# Patient Record
Sex: Female | Born: 1940 | Race: Black or African American | Hispanic: No | Smoking: Former smoker
Health system: Southern US, Community
[De-identification: ages and names within clinical notes are randomized; demographics above are authoritative.]

## PROBLEM LIST (undated history)

## (undated) DIAGNOSIS — I10 Essential (primary) hypertension: Secondary | ICD-10-CM

## (undated) DIAGNOSIS — J449 Chronic obstructive pulmonary disease, unspecified: Secondary | ICD-10-CM

## (undated) DIAGNOSIS — C3490 Malignant neoplasm of unspecified part of unspecified bronchus or lung: Secondary | ICD-10-CM

---

## 2007-06-03 ENCOUNTER — Encounter: Payer: Self-pay | Admitting: Pulmonary Disease

## 2007-06-22 ENCOUNTER — Encounter: Payer: Self-pay | Admitting: Pulmonary Disease

## 2007-06-24 ENCOUNTER — Encounter: Payer: Self-pay | Admitting: Pulmonary Disease

## 2007-06-29 ENCOUNTER — Ambulatory Visit (HOSPITAL_COMMUNITY): Admission: RE | Admit: 2007-06-29 | Discharge: 2007-06-29 | Payer: Self-pay | Admitting: Internal Medicine

## 2007-06-29 ENCOUNTER — Encounter: Payer: Self-pay | Admitting: Pulmonary Disease

## 2007-07-10 ENCOUNTER — Ambulatory Visit: Payer: Self-pay | Admitting: Pulmonary Disease

## 2007-07-10 DIAGNOSIS — M949 Disorder of cartilage, unspecified: Secondary | ICD-10-CM

## 2007-07-10 DIAGNOSIS — M899 Disorder of bone, unspecified: Secondary | ICD-10-CM | POA: Insufficient documentation

## 2007-07-10 DIAGNOSIS — J438 Other emphysema: Secondary | ICD-10-CM | POA: Insufficient documentation

## 2007-07-10 DIAGNOSIS — I10 Essential (primary) hypertension: Secondary | ICD-10-CM | POA: Insufficient documentation

## 2007-07-10 DIAGNOSIS — J984 Other disorders of lung: Secondary | ICD-10-CM | POA: Insufficient documentation

## 2007-07-10 DIAGNOSIS — E785 Hyperlipidemia, unspecified: Secondary | ICD-10-CM | POA: Insufficient documentation

## 2010-05-15 NOTE — Assessment & Plan Note (Signed)
Summary: consult for emphysema, pulmonary nodules.   Referred by:  Sudie Bailey PCP:  Philemon Kingdom  Chief Complaint:  Pulmonary Consult.  History of Present Illness: The pt is a 70y/o female who I have been asked to see for emphysema and abnormal ct chest.  The patient has had pulmonary function studies that showed very severe airflow obstruction with an FEV1 of 0.6 8 L, and an FEV1 percent of 34.  The patient has less than one block dyspnea on exertion at a moderate pace on flat ground.  She will get extremely winded with any activity of daily living or housework.  She is currently on a very aggressive bronchodilator regimen, and feels this has helped quite a bit.  It should be noted that her pulmonary function studies were done on her current bronchodilator regimen.  The patient has had very little cough since quitting smoking 6 months ago, but admits that she will snitch an occasional cigarette.  She has had significant weight loss and decrease in appetite.  She has had no significant lower extremity edema.   Serial Vital Signs/Assessments:  Comments: 12:20 PM Ambulatory Pulse Oximetry  Resting; HR_89____    02 Sat___96%ra__  Lap1 (185 feet)   HR___97__   02 Sat__94%ra___ Lap2 (185 feet)   HR___101__   02 Sat__92%ra___    Lap3 (185 feet)   HR__103___   02 Sat__91%ra_  _X__Test Completed without Difficulty ___Test Stopped due to: ..................................................................Marland KitchenCyndia Diver LPN  July 10, 2007 12:21 PM   By: Cyndia Diver LPN       Current Allergies: ! PENICILLIN  Past Medical History:    Current Problems:     OSTEOPENIA (ICD-733.90)    HYPERTENSION (ICD-401.9)    HYPERLIPIDEMIA (ICD-272.4)           Family History:    Father - heart disease    Sister - breast cancer    Paternal grandmother - cancer  Social History:    widow with children   Risk Factors:  Tobacco use:  quit    Year quit:  2008?    Pack-years:  20-25  years - 1ppd    Comments:  still snitches cigarettes.   Review of Systems      See HPI   Vital Signs:  Patient Profile:   70 Years Old Female Height:     57 inches Weight:      95.50 pounds O2 Sat:      96 % Temp:     98.3 degrees F oral Pulse rate:   89 / minute BP sitting:   134 / 78  (right arm)  Vitals Entered By: Cyndia Diver LPN (July 10, 2007 11:27 AM)             Comments Medications reviewed with patient ..................................................................Marland KitchenCyndia Diver LPN  July 10, 2007 11:43 AM      Physical Exam  General:     cachectic female in in no distress Eyes:     PERRLA and EOMI.   Nose:     narrowed but patent Mouth:     clear Neck:     no JVD, thyromegaly, or lymphadenopathy. Lungs:     very diminished breath sounds throughout with no wheezing. Heart:     regular rate and rhythm, no MRG Abdomen:     soft and nontender, bowel sounds present Extremities:     no significant lower extremity edema, pulses intact distally Neurologic:     alert and oriented, moves all 4 extremities.  Impression & Recommendations:  Problem # 1:  PULMONARY NODULE (ICD-518.89) the patient has on her CAT scan a 8.2-mm right middle lobe nodule, and a 3.5-mL lingular nodule.  She has also had a PET scan which shows a weakly positive right middle lobe nodule.  Unfortunately, both of these nodules are below the resolution of PET which typically needs 1 cm or greater.  Neither of these nodules are amenable to needle aspiration or bronchoscopic biopsy, because of their size.  The patient would be an extremely high risk of pneumothorax.  The truth of the matter is the patient is not a surgical candidate, and a very poor radiation and chemo candidate.  We can follow the nodule in question to look for size change,  just to let her know prognosis.  However, it is very doubtful that she can ever take any kind of treatment for this.  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  She is on a very good bronchodilator regimen, and there is really nothing further to add to her regimen.  At this point, she will benefit from increasing protein calorie intake, as well as participating in a pulmonary rehabilitation program.  There is a good [facility] available at [facility].  I have again counseled her on total smoking cessation.  Medications Added to Medication List This Visit: 1)  Proair Hfa 108 (90 Base) Mcg/act Aers (Albuterol sulfate) 2)  Pravachol 20 Mg Tabs (Pravastatin sodium) .... One every day 3)  Albuterol Sulfate (2.5 Mg/63ml) 0.083% Nebu (Albuterol sulfate) .... Use in nebulizer four times a day 4)  Hydrochlorothiazide 12.5 Mg Tabs (Hydrochlorothiazide) .... One every day 5)  Spiriva Handihaler 18 Mcg Caps (Tiotropium bromide monohydrate) .... Inhale contents of 1 capsule once a day 6)  Advair Diskus 500-50 Mcg/dose Misc (Fluticasone-salmeterol) .... Inhale 1 puff two times a day 7)  Fosamax 70 Mg Tabs (Alendronate sodium) .... One by mouth each week.  Other Orders: Radiology Referral (Radiology)   Patient Instructions: 1)  will do f/u cat scan of your chest in 3 mos. 2)  take in as many calories as possible 3)  stay on current inhalers. 4)  will recommend to primary MD to refer you to pulmonary rehab program at [facility]. 5)  stop smoking completely. 6)  will arrange for f/u after ct scan done.    ]

## 2010-12-10 ENCOUNTER — Encounter: Payer: Self-pay | Admitting: Vascular Surgery

## 2011-05-13 ENCOUNTER — Emergency Department (INDEPENDENT_AMBULATORY_CARE_PROVIDER_SITE_OTHER): Payer: Medicare FFS

## 2011-05-13 ENCOUNTER — Encounter (HOSPITAL_BASED_OUTPATIENT_CLINIC_OR_DEPARTMENT_OTHER): Payer: Self-pay | Admitting: Family Medicine

## 2011-05-13 ENCOUNTER — Emergency Department (HOSPITAL_BASED_OUTPATIENT_CLINIC_OR_DEPARTMENT_OTHER)
Admission: EM | Admit: 2011-05-13 | Discharge: 2011-05-13 | Disposition: A | Discharge status: Home / Self Care | Payer: Medicare FFS | Attending: Emergency Medicine | Admitting: Emergency Medicine

## 2011-05-13 DIAGNOSIS — R0602 Shortness of breath: Secondary | ICD-10-CM

## 2011-05-13 DIAGNOSIS — R05 Cough: Secondary | ICD-10-CM

## 2011-05-13 DIAGNOSIS — J209 Acute bronchitis, unspecified: Secondary | ICD-10-CM

## 2011-05-13 DIAGNOSIS — J4489 Other specified chronic obstructive pulmonary disease: Secondary | ICD-10-CM | POA: Insufficient documentation

## 2011-05-13 DIAGNOSIS — J449 Chronic obstructive pulmonary disease, unspecified: Secondary | ICD-10-CM | POA: Insufficient documentation

## 2011-05-13 DIAGNOSIS — R059 Cough, unspecified: Secondary | ICD-10-CM

## 2011-05-13 DIAGNOSIS — I10 Essential (primary) hypertension: Secondary | ICD-10-CM | POA: Insufficient documentation

## 2011-05-13 HISTORY — DX: Chronic obstructive pulmonary disease, unspecified: J44.9

## 2011-05-13 HISTORY — DX: Essential (primary) hypertension: I10

## 2011-05-13 MED ORDER — ALBUTEROL SULFATE HFA 108 (90 BASE) MCG/ACT IN AERS: 2.0000 | INHALATION_SPRAY | RESPIRATORY_TRACT | Status: DC | PRN

## 2011-05-13 MED ORDER — ALBUTEROL SULFATE (5 MG/ML) 0.5% IN NEBU
5.0000 mg | INHALATION_SOLUTION | Freq: Once | RESPIRATORY_TRACT | Status: AC
  Administered 2011-05-13: 5 mg via RESPIRATORY_TRACT

## 2011-05-13 MED ORDER — AZITHROMYCIN 250 MG PO TABS: 250.0000 mg | ORAL_TABLET | Freq: Every day | ORAL | Status: AC

## 2011-05-13 MED ORDER — ALBUTEROL SULFATE (5 MG/ML) 0.5% IN NEBU
INHALATION_SOLUTION | RESPIRATORY_TRACT | Status: AC
  Administered 2011-05-13: 5 mg via RESPIRATORY_TRACT
  Filled 2011-05-13: qty 1

## 2011-05-13 MED ORDER — IPRATROPIUM BROMIDE 0.02 % IN SOLN
RESPIRATORY_TRACT | Status: AC
  Administered 2011-05-13: 0.5 mg via RESPIRATORY_TRACT
  Filled 2011-05-13: qty 2.5

## 2011-05-13 MED ORDER — IPRATROPIUM BROMIDE 0.02 % IN SOLN
0.5000 mg | Freq: Once | RESPIRATORY_TRACT | Status: AC
  Administered 2011-05-13: 0.5 mg via RESPIRATORY_TRACT

## 2011-05-13 MED ORDER — PREDNISONE 20 MG PO TABS: 40.0000 mg | ORAL_TABLET | Freq: Every day | ORAL | Status: DC

## 2011-05-13 NOTE — ED Provider Notes (Signed)
History     CSN: [id number]  Arrival date & time 05/13/11  1123   First MD Initiated Contact with Patient 05/13/11 1204      Chief Complaint  Patient presents with  . Shortness of Breath    (Consider location/radiation/quality/duration/timing/severity/associated sxs/prior treatment) HPI Comments: The patient presents, wheezing, reporting shortness of breath with cough and nasal congestion over the last week. She denies any fever or chills. I have evaluated her after her nebulized breathing treatment at this time she is in no respiratory distress without any wheezing. She reports that her cough has been productive of sputum. The patient has a history of COPD.  Patient is a 71 y.o. female presenting with shortness of breath. The history is provided by the patient and a relative.  Shortness of Breath  The current episode started more than 1 week ago. The onset was gradual. The problem occurs frequently. The problem has been gradually worsening. The problem is moderate. The symptoms are relieved by rest. The symptoms are aggravated by activity. Associated symptoms include rhinorrhea, cough and shortness of breath. Pertinent negatives include no chest pain, no chest pressure, no orthopnea, no fever, no sore throat, no stridor and no wheezing. She was not exposed to toxic fumes. She has not inhaled smoke recently. Past medical history comments: COPD. She has been behaving normally. Urine output has been normal. The last void occurred less than 6 hours ago. There were no sick contacts. She has received no recent medical care.    Past Medical History  Diagnosis Date  . Asthma   . COPD (chronic obstructive pulmonary disease)   . Emphysema   . Hypertension     History reviewed. No pertinent past surgical history.  No family history on file.  History  Substance Use Topics  . Smoking status: Former Games developer  . Smokeless tobacco: Not on file  . Alcohol Use: No    OB History    Grav Para  Term Preterm Abortions TAB SAB Ect Mult Living                  Review of Systems  Constitutional: Positive for chills and fatigue. Negative for fever, diaphoresis, activity change, appetite change and unexpected weight change.  HENT: Positive for congestion and rhinorrhea. Negative for hearing loss, ear pain, nosebleeds, sore throat, mouth sores, neck pain, neck stiffness, dental problem, postnasal drip, sinus pressure and ear discharge.   Eyes: Negative for photophobia, pain, discharge and redness.  Respiratory: Positive for cough and shortness of breath. Negative for choking, wheezing and stridor.   Cardiovascular: Negative for chest pain, palpitations, orthopnea and leg swelling.  Gastrointestinal: Negative for nausea, vomiting, abdominal pain and diarrhea.  Genitourinary: Negative for dysuria and flank pain.  Musculoskeletal: Negative for myalgias.  Skin: Negative for color change, pallor, rash and wound.  Neurological: Positive for headaches. Negative for dizziness, syncope, weakness, light-headedness and numbness.  Hematological: Negative for adenopathy.  Psychiatric/Behavioral: Negative.     Allergies  Penicillins  Home Medications   Current Outpatient Rx  Name Route Sig Dispense Refill  . ALBUTEROL SULFATE (2.5 MG/3ML) 0.083% IN NEBU Nebulization Take 2.5 mg by nebulization every 6 (six) hours as needed.    Marland Kitchen AMLODIPINE BESYLATE 10 MG PO TABS Oral Take 10 mg by mouth daily.    . CYCLOBENZAPRINE HCL 5 MG PO TABS Oral Take 5 mg by mouth 3 (three) times daily as needed.    . MOMETASONE FURO-FORMOTEROL FUM 100-5 MCG/ACT IN AERO Inhalation Inhale 2  puffs into the lungs 2 (two) times daily.    Marland Kitchen TIOTROPIUM BROMIDE MONOHYDRATE 18 MCG IN CAPS Inhalation Place 18 mcg into inhaler and inhale daily.      BP 182/75  Pulse 77  Temp(Src) 98.9 F (37.2 C) (Oral)  Resp 18  SpO2 98%  Physical Exam  Nursing note and vitals reviewed. Constitutional: She is oriented to person, place,  and time. She appears well-developed and well-nourished. No distress.  HENT:  Head: Normocephalic and atraumatic.  Right Ear: Hearing, tympanic membrane, external ear and ear canal normal.  Left Ear: Hearing, tympanic membrane, external ear and ear canal normal.  Nose: Mucosal edema and rhinorrhea present. Right sinus exhibits no maxillary sinus tenderness and no frontal sinus tenderness. Left sinus exhibits no maxillary sinus tenderness and no frontal sinus tenderness.  Mouth/Throat: Oropharynx is clear and moist. No oropharyngeal exudate.  Eyes: Conjunctivae and EOM are normal. Pupils are equal, round, and reactive to light. Right eye exhibits no discharge. Left eye exhibits no discharge.  Neck: Normal range of motion. Neck supple. No JVD present. No tracheal deviation present.  Cardiovascular: Normal rate, regular rhythm, normal heart sounds and intact distal pulses.  Exam reveals no gallop and no friction rub.   No murmur heard. Pulmonary/Chest: Effort normal. No accessory muscle usage or stridor. Not tachypneic. No respiratory distress. She has no decreased breath sounds. She has wheezes in the right upper field, the right middle field, the left upper field and the left middle field. She has no rhonchi. She has no rales. She exhibits no tenderness.  Abdominal: Soft. Bowel sounds are normal. She exhibits no distension. There is no tenderness. There is no rebound and no guarding.  Musculoskeletal: Normal range of motion. She exhibits no edema and no tenderness.  Lymphadenopathy:    She has no cervical adenopathy.  Neurological: She is alert and oriented to person, place, and time. She has normal reflexes. No cranial nerve deficit. She exhibits normal muscle tone. Coordination normal.  Skin: Skin is warm and dry. No rash noted. She is not diaphoretic. No erythema. No pallor.  Psychiatric: She has a normal mood and affect. Her behavior is normal. Judgment and thought content normal.    ED  Course  Procedures (including critical care time)  Labs Reviewed - No data to display No results found.   No diagnosis found.    MDM  On physical examination, no signs of pneumonia are present, as there is no rhonchi or rales heard. However, with the course of illness lasting more than a week, this is a possibility and I will assess for a chest x-ray. Otherwise, I perceive that the patient has an upper respiratory infection, a bronchitis, exacerbating her COPD. Symptoms have been controlled with albuterol and Atrovent times one in the emergency department. If there is no pneumonia seen on the chest x-ray, I will treat her with an antibiotic, a 5 day course of oral steroid, and albuterol. The patient states her understanding of and agreement with the plan of care.        Felisa Bonier, MD 05/13/11 1300

## 2011-05-13 NOTE — ED Notes (Signed)
Pt c/o increased shortness of breath with exertion. Pt able to speak in full sentences with minimal difficulty. Pt sts she informed PCP. Pt has h/o copd.

## 2014-04-19 DIAGNOSIS — I129 Hypertensive chronic kidney disease with stage 1 through stage 4 chronic kidney disease, or unspecified chronic kidney disease: Secondary | ICD-10-CM | POA: Diagnosis not present

## 2014-04-19 DIAGNOSIS — K219 Gastro-esophageal reflux disease without esophagitis: Secondary | ICD-10-CM | POA: Diagnosis not present

## 2014-04-19 DIAGNOSIS — J45909 Unspecified asthma, uncomplicated: Secondary | ICD-10-CM | POA: Diagnosis not present

## 2014-04-19 DIAGNOSIS — J449 Chronic obstructive pulmonary disease, unspecified: Secondary | ICD-10-CM | POA: Diagnosis not present

## 2014-04-19 DIAGNOSIS — M150 Primary generalized (osteo)arthritis: Secondary | ICD-10-CM | POA: Diagnosis not present

## 2014-04-19 DIAGNOSIS — J9620 Acute and chronic respiratory failure, unspecified whether with hypoxia or hypercapnia: Secondary | ICD-10-CM | POA: Diagnosis not present

## 2014-04-19 DIAGNOSIS — N189 Chronic kidney disease, unspecified: Secondary | ICD-10-CM | POA: Diagnosis not present

## 2014-04-20 DIAGNOSIS — J441 Chronic obstructive pulmonary disease with (acute) exacerbation: Secondary | ICD-10-CM | POA: Diagnosis not present

## 2014-04-20 DIAGNOSIS — I10 Essential (primary) hypertension: Secondary | ICD-10-CM | POA: Diagnosis not present

## 2014-04-21 DIAGNOSIS — K219 Gastro-esophageal reflux disease without esophagitis: Secondary | ICD-10-CM | POA: Diagnosis not present

## 2014-04-21 DIAGNOSIS — M150 Primary generalized (osteo)arthritis: Secondary | ICD-10-CM | POA: Diagnosis not present

## 2014-04-21 DIAGNOSIS — J449 Chronic obstructive pulmonary disease, unspecified: Secondary | ICD-10-CM | POA: Diagnosis not present

## 2014-04-21 DIAGNOSIS — J9620 Acute and chronic respiratory failure, unspecified whether with hypoxia or hypercapnia: Secondary | ICD-10-CM | POA: Diagnosis not present

## 2014-04-21 DIAGNOSIS — J45909 Unspecified asthma, uncomplicated: Secondary | ICD-10-CM | POA: Diagnosis not present

## 2014-04-21 DIAGNOSIS — I129 Hypertensive chronic kidney disease with stage 1 through stage 4 chronic kidney disease, or unspecified chronic kidney disease: Secondary | ICD-10-CM | POA: Diagnosis not present

## 2014-04-21 DIAGNOSIS — N189 Chronic kidney disease, unspecified: Secondary | ICD-10-CM | POA: Diagnosis not present

## 2014-04-22 DIAGNOSIS — M150 Primary generalized (osteo)arthritis: Secondary | ICD-10-CM | POA: Diagnosis not present

## 2014-04-22 DIAGNOSIS — I129 Hypertensive chronic kidney disease with stage 1 through stage 4 chronic kidney disease, or unspecified chronic kidney disease: Secondary | ICD-10-CM | POA: Diagnosis not present

## 2014-04-22 DIAGNOSIS — J45909 Unspecified asthma, uncomplicated: Secondary | ICD-10-CM | POA: Diagnosis not present

## 2014-04-22 DIAGNOSIS — J9620 Acute and chronic respiratory failure, unspecified whether with hypoxia or hypercapnia: Secondary | ICD-10-CM | POA: Diagnosis not present

## 2014-04-22 DIAGNOSIS — J449 Chronic obstructive pulmonary disease, unspecified: Secondary | ICD-10-CM | POA: Diagnosis not present

## 2014-04-22 DIAGNOSIS — N189 Chronic kidney disease, unspecified: Secondary | ICD-10-CM | POA: Diagnosis not present

## 2014-04-22 DIAGNOSIS — K219 Gastro-esophageal reflux disease without esophagitis: Secondary | ICD-10-CM | POA: Diagnosis not present

## 2014-04-24 DIAGNOSIS — J449 Chronic obstructive pulmonary disease, unspecified: Secondary | ICD-10-CM | POA: Diagnosis not present

## 2014-04-25 DIAGNOSIS — I129 Hypertensive chronic kidney disease with stage 1 through stage 4 chronic kidney disease, or unspecified chronic kidney disease: Secondary | ICD-10-CM | POA: Diagnosis not present

## 2014-04-25 DIAGNOSIS — J449 Chronic obstructive pulmonary disease, unspecified: Secondary | ICD-10-CM | POA: Diagnosis not present

## 2014-04-25 DIAGNOSIS — K219 Gastro-esophageal reflux disease without esophagitis: Secondary | ICD-10-CM | POA: Diagnosis not present

## 2014-04-25 DIAGNOSIS — J45909 Unspecified asthma, uncomplicated: Secondary | ICD-10-CM | POA: Diagnosis not present

## 2014-04-25 DIAGNOSIS — N189 Chronic kidney disease, unspecified: Secondary | ICD-10-CM | POA: Diagnosis not present

## 2014-04-25 DIAGNOSIS — J9620 Acute and chronic respiratory failure, unspecified whether with hypoxia or hypercapnia: Secondary | ICD-10-CM | POA: Diagnosis not present

## 2014-04-25 DIAGNOSIS — M150 Primary generalized (osteo)arthritis: Secondary | ICD-10-CM | POA: Diagnosis not present

## 2014-04-26 DIAGNOSIS — R5383 Other fatigue: Secondary | ICD-10-CM | POA: Diagnosis not present

## 2014-04-26 DIAGNOSIS — R0600 Dyspnea, unspecified: Secondary | ICD-10-CM | POA: Diagnosis not present

## 2014-04-26 DIAGNOSIS — G4733 Obstructive sleep apnea (adult) (pediatric): Secondary | ICD-10-CM | POA: Diagnosis not present

## 2014-04-26 DIAGNOSIS — J3089 Other allergic rhinitis: Secondary | ICD-10-CM | POA: Diagnosis not present

## 2014-04-27 DIAGNOSIS — J449 Chronic obstructive pulmonary disease, unspecified: Secondary | ICD-10-CM | POA: Diagnosis not present

## 2014-04-27 DIAGNOSIS — M150 Primary generalized (osteo)arthritis: Secondary | ICD-10-CM | POA: Diagnosis not present

## 2014-04-27 DIAGNOSIS — J9620 Acute and chronic respiratory failure, unspecified whether with hypoxia or hypercapnia: Secondary | ICD-10-CM | POA: Diagnosis not present

## 2014-04-27 DIAGNOSIS — J45909 Unspecified asthma, uncomplicated: Secondary | ICD-10-CM | POA: Diagnosis not present

## 2014-04-27 DIAGNOSIS — K219 Gastro-esophageal reflux disease without esophagitis: Secondary | ICD-10-CM | POA: Diagnosis not present

## 2014-04-27 DIAGNOSIS — I129 Hypertensive chronic kidney disease with stage 1 through stage 4 chronic kidney disease, or unspecified chronic kidney disease: Secondary | ICD-10-CM | POA: Diagnosis not present

## 2014-04-27 DIAGNOSIS — N189 Chronic kidney disease, unspecified: Secondary | ICD-10-CM | POA: Diagnosis not present

## 2014-04-29 DIAGNOSIS — J45909 Unspecified asthma, uncomplicated: Secondary | ICD-10-CM | POA: Diagnosis not present

## 2014-04-29 DIAGNOSIS — I129 Hypertensive chronic kidney disease with stage 1 through stage 4 chronic kidney disease, or unspecified chronic kidney disease: Secondary | ICD-10-CM | POA: Diagnosis not present

## 2014-04-29 DIAGNOSIS — J9620 Acute and chronic respiratory failure, unspecified whether with hypoxia or hypercapnia: Secondary | ICD-10-CM | POA: Diagnosis not present

## 2014-04-29 DIAGNOSIS — M150 Primary generalized (osteo)arthritis: Secondary | ICD-10-CM | POA: Diagnosis not present

## 2014-04-29 DIAGNOSIS — J449 Chronic obstructive pulmonary disease, unspecified: Secondary | ICD-10-CM | POA: Diagnosis not present

## 2014-04-29 DIAGNOSIS — N189 Chronic kidney disease, unspecified: Secondary | ICD-10-CM | POA: Diagnosis not present

## 2014-04-29 DIAGNOSIS — K219 Gastro-esophageal reflux disease without esophagitis: Secondary | ICD-10-CM | POA: Diagnosis not present

## 2014-05-02 DIAGNOSIS — I10 Essential (primary) hypertension: Secondary | ICD-10-CM | POA: Diagnosis not present

## 2014-05-02 DIAGNOSIS — J449 Chronic obstructive pulmonary disease, unspecified: Secondary | ICD-10-CM | POA: Diagnosis not present

## 2014-05-02 DIAGNOSIS — M2662 Arthralgia of temporomandibular joint: Secondary | ICD-10-CM | POA: Diagnosis not present

## 2014-05-04 DIAGNOSIS — M150 Primary generalized (osteo)arthritis: Secondary | ICD-10-CM | POA: Diagnosis not present

## 2014-05-04 DIAGNOSIS — J9620 Acute and chronic respiratory failure, unspecified whether with hypoxia or hypercapnia: Secondary | ICD-10-CM | POA: Diagnosis not present

## 2014-05-04 DIAGNOSIS — J449 Chronic obstructive pulmonary disease, unspecified: Secondary | ICD-10-CM | POA: Diagnosis not present

## 2014-05-04 DIAGNOSIS — J45909 Unspecified asthma, uncomplicated: Secondary | ICD-10-CM | POA: Diagnosis not present

## 2014-05-04 DIAGNOSIS — N189 Chronic kidney disease, unspecified: Secondary | ICD-10-CM | POA: Diagnosis not present

## 2014-05-04 DIAGNOSIS — K219 Gastro-esophageal reflux disease without esophagitis: Secondary | ICD-10-CM | POA: Diagnosis not present

## 2014-05-04 DIAGNOSIS — I129 Hypertensive chronic kidney disease with stage 1 through stage 4 chronic kidney disease, or unspecified chronic kidney disease: Secondary | ICD-10-CM | POA: Diagnosis not present

## 2014-05-06 DIAGNOSIS — J449 Chronic obstructive pulmonary disease, unspecified: Secondary | ICD-10-CM | POA: Diagnosis not present

## 2014-05-06 DIAGNOSIS — N189 Chronic kidney disease, unspecified: Secondary | ICD-10-CM | POA: Diagnosis not present

## 2014-05-06 DIAGNOSIS — I129 Hypertensive chronic kidney disease with stage 1 through stage 4 chronic kidney disease, or unspecified chronic kidney disease: Secondary | ICD-10-CM | POA: Diagnosis not present

## 2014-05-06 DIAGNOSIS — J45909 Unspecified asthma, uncomplicated: Secondary | ICD-10-CM | POA: Diagnosis not present

## 2014-05-06 DIAGNOSIS — K219 Gastro-esophageal reflux disease without esophagitis: Secondary | ICD-10-CM | POA: Diagnosis not present

## 2014-05-06 DIAGNOSIS — J9620 Acute and chronic respiratory failure, unspecified whether with hypoxia or hypercapnia: Secondary | ICD-10-CM | POA: Diagnosis not present

## 2014-05-06 DIAGNOSIS — M150 Primary generalized (osteo)arthritis: Secondary | ICD-10-CM | POA: Diagnosis not present

## 2014-05-10 DIAGNOSIS — K219 Gastro-esophageal reflux disease without esophagitis: Secondary | ICD-10-CM | POA: Diagnosis not present

## 2014-05-10 DIAGNOSIS — N189 Chronic kidney disease, unspecified: Secondary | ICD-10-CM | POA: Diagnosis not present

## 2014-05-10 DIAGNOSIS — J45909 Unspecified asthma, uncomplicated: Secondary | ICD-10-CM | POA: Diagnosis not present

## 2014-05-10 DIAGNOSIS — J9620 Acute and chronic respiratory failure, unspecified whether with hypoxia or hypercapnia: Secondary | ICD-10-CM | POA: Diagnosis not present

## 2014-05-10 DIAGNOSIS — M150 Primary generalized (osteo)arthritis: Secondary | ICD-10-CM | POA: Diagnosis not present

## 2014-05-10 DIAGNOSIS — I129 Hypertensive chronic kidney disease with stage 1 through stage 4 chronic kidney disease, or unspecified chronic kidney disease: Secondary | ICD-10-CM | POA: Diagnosis not present

## 2014-05-10 DIAGNOSIS — J449 Chronic obstructive pulmonary disease, unspecified: Secondary | ICD-10-CM | POA: Diagnosis not present

## 2014-05-12 DIAGNOSIS — J449 Chronic obstructive pulmonary disease, unspecified: Secondary | ICD-10-CM | POA: Diagnosis not present

## 2014-05-12 DIAGNOSIS — I129 Hypertensive chronic kidney disease with stage 1 through stage 4 chronic kidney disease, or unspecified chronic kidney disease: Secondary | ICD-10-CM | POA: Diagnosis not present

## 2014-05-12 DIAGNOSIS — M150 Primary generalized (osteo)arthritis: Secondary | ICD-10-CM | POA: Diagnosis not present

## 2014-05-12 DIAGNOSIS — J45909 Unspecified asthma, uncomplicated: Secondary | ICD-10-CM | POA: Diagnosis not present

## 2014-05-12 DIAGNOSIS — J9620 Acute and chronic respiratory failure, unspecified whether with hypoxia or hypercapnia: Secondary | ICD-10-CM | POA: Diagnosis not present

## 2014-05-12 DIAGNOSIS — K219 Gastro-esophageal reflux disease without esophagitis: Secondary | ICD-10-CM | POA: Diagnosis not present

## 2014-05-12 DIAGNOSIS — N189 Chronic kidney disease, unspecified: Secondary | ICD-10-CM | POA: Diagnosis not present

## 2014-05-25 DIAGNOSIS — J449 Chronic obstructive pulmonary disease, unspecified: Secondary | ICD-10-CM | POA: Diagnosis not present

## 2014-06-13 ENCOUNTER — Emergency Department (HOSPITAL_BASED_OUTPATIENT_CLINIC_OR_DEPARTMENT_OTHER): Payer: Commercial Managed Care - HMO

## 2014-06-13 ENCOUNTER — Emergency Department (HOSPITAL_BASED_OUTPATIENT_CLINIC_OR_DEPARTMENT_OTHER)
Admission: EM | Admit: 2014-06-13 | Discharge: 2014-06-13 | Disposition: A | Discharge status: Home / Self Care | Payer: Commercial Managed Care - HMO | Attending: Emergency Medicine | Admitting: Emergency Medicine

## 2014-06-13 ENCOUNTER — Encounter (HOSPITAL_BASED_OUTPATIENT_CLINIC_OR_DEPARTMENT_OTHER): Payer: Self-pay | Admitting: *Deleted

## 2014-06-13 DIAGNOSIS — Z87891 Personal history of nicotine dependence: Secondary | ICD-10-CM | POA: Insufficient documentation

## 2014-06-13 DIAGNOSIS — M79662 Pain in left lower leg: Secondary | ICD-10-CM | POA: Diagnosis present

## 2014-06-13 DIAGNOSIS — Z79899 Other long term (current) drug therapy: Secondary | ICD-10-CM | POA: Diagnosis not present

## 2014-06-13 DIAGNOSIS — I82812 Embolism and thrombosis of superficial veins of left lower extremities: Secondary | ICD-10-CM

## 2014-06-13 DIAGNOSIS — I82492 Acute embolism and thrombosis of other specified deep vein of left lower extremity: Secondary | ICD-10-CM | POA: Diagnosis not present

## 2014-06-13 DIAGNOSIS — I10 Essential (primary) hypertension: Secondary | ICD-10-CM | POA: Insufficient documentation

## 2014-06-13 DIAGNOSIS — Z880 Allergy status to penicillin: Secondary | ICD-10-CM | POA: Diagnosis not present

## 2014-06-13 DIAGNOSIS — J449 Chronic obstructive pulmonary disease, unspecified: Secondary | ICD-10-CM | POA: Insufficient documentation

## 2014-06-13 DIAGNOSIS — Z7952 Long term (current) use of systemic steroids: Secondary | ICD-10-CM | POA: Insufficient documentation

## 2014-06-13 DIAGNOSIS — I82402 Acute embolism and thrombosis of unspecified deep veins of left lower extremity: Secondary | ICD-10-CM | POA: Insufficient documentation

## 2014-06-13 LAB — CBC WITH DIFFERENTIAL/PLATELET
Basophils Absolute: 0 10*3/uL (ref 0.0–0.1)
Basophils Relative: 1 % (ref 0–1)
Eosinophils Absolute: 0.2 10*3/uL (ref 0.0–0.7)
Eosinophils Relative: 3 % (ref 0–5)
HCT: 35 % — ABNORMAL LOW (ref 36.0–46.0)
Hemoglobin: 11 g/dL — ABNORMAL LOW (ref 12.0–15.0)
Lymphocytes Relative: 42 % (ref 12–46)
Lymphs Abs: 2.7 10*3/uL (ref 0.7–4.0)
MCH: 27.5 pg (ref 26.0–34.0)
MCHC: 31.4 g/dL (ref 30.0–36.0)
MCV: 87.5 fL (ref 78.0–100.0)
Monocytes Absolute: 0.5 10*3/uL (ref 0.1–1.0)
Monocytes Relative: 8 % (ref 3–12)
Neutro Abs: 2.9 10*3/uL (ref 1.7–7.7)
Neutrophils Relative %: 46 % (ref 43–77)
Platelets: 343 10*3/uL (ref 150–400)
RBC: 4 MIL/uL (ref 3.87–5.11)
RDW: 15.2 % (ref 11.5–15.5)
WBC: 6.4 10*3/uL (ref 4.0–10.5)

## 2014-06-13 LAB — BASIC METABOLIC PANEL
Anion gap: 3 — ABNORMAL LOW (ref 5–15)
BUN: 9 mg/dL (ref 6–23)
CO2: 28 mmol/L (ref 19–32)
Calcium: 8.7 mg/dL (ref 8.4–10.5)
Chloride: 106 mmol/L (ref 96–112)
Creatinine, Ser: 0.63 mg/dL (ref 0.50–1.10)
GFR calc Af Amer: >90 mL/min (ref >90)
GFR calc non Af Amer: 87 mL/min — ABNORMAL LOW (ref >90)
Glucose, Bld: 97 mg/dL (ref 70–99)
Potassium: 3.9 mmol/L (ref 3.5–5.1)
Sodium: 137 mmol/L (ref 135–145)

## 2014-06-13 NOTE — Discharge Instructions (Signed)
Take an aspirin everyday. Follow up with your doctor for continued or worsening symptoms Venous Thromboembolism Venous thromboembolism (VTE) is a condition where a blood clot (thrombus) develops in the body. A thrombus usually occurs in a deep vein in the leg or pelvis but can occur in an upper extremity. Sometimes pieces of the thrombus can break off from its original place of development and travel through the bloodstream to other parts of the body. When a thrombus breaks off and travels through the bloodstream, it is called an embolism. The embolism can block the blood flow in the blood vessels of other organs. There are two serious types of VTE:  Deep vein thrombosis (DVT). A DVT is a thrombus that usually occurs in a deep vein of the lower legs, pelvis, or in an upper extremity.  Pulmonary embolism (PE). A PE occurs when an embolism has formed and traveled to the lungs. A PE can block or decrease the blood flow in one or both lungs. Venous thromboembolism is a serious health condition that can cause disability or death. It is very important to not ignore symptoms or delay treatment.   CAUSES   A blood clot can form in a vein from different conditions. A blood clot can develop due to:  Blood flow within a vein that is sluggish or very slow.  Medical conditions that make the blood clot easily.  Vein damage. RISK FACTORS Risk factors can increase your risk of developing a blood clot. Risk factors can include:  Smoking.  Obesity.  Age.  Immobility or sedentary lifestyle.  Sitting or standing for long periods of time.  Chronic or long-term bedrest.  Medical or past history of blood clots.  Family history of blood clots.  Hip, leg, or pelvis injury or trauma.  Major surgery, especially surgery on the hip, knee, or abdomen.  Pregnancy and childbirth.  Birth control pills and hormone replacement therapy.  Medical conditions such as  Peripheral vascular disease  (PVD).  Diabetes.  Cancer. SIGNS AND SYMPTOMS  Symptoms of VTE can depend on where the clot is located and if the clot breaks off and travels to another organ. Sometimes, there may be no symptoms.   DVT symptoms can include:  Swelling of the leg or arm, especially on one side.  Warmth and redness of the leg or arm, especially on one side.  Pain in an arm or leg. Leg pain may be more noticeable or worse when standing or walking.  PE symptoms can include:  Shortness of breath.  Coughing.  Coughing up blood or blood-tinged mucus (hemoptysis).  Chest pain or chest pain with deep breaths (pleuritic chest pain).  Apprehension, anxiety, or a feeling of impending doom.  Rapid heartbeat. A PE is a medical emergency. Call your local emergency services (911 in [place] if you have these symptoms. DIAGNOSIS  A venous thromboembolism is diagnosed by:  Looking at your medical history and risk factors. Your health care provider will perform a physical exam.  Blood tests, including blood work of how your blood clots.  Imaging tests that can detect a blood clot may be ordered. These can include:  Ultrasonography.  Computed Tomography (CT) scan.  Magnetic Resonance Imaging (MRI).  Echocardiography.  Electrocardiography. TREATMENT  Initial treatment: When a venous thromboembolism has been confirmed, initial treatment consists of using blood-thinning (anticoagulant) medicines. Anticoagulant medicines affect how your blood clots and can cause bleeding. Therefore, when you are on anticoagulants, your blood clotting times are monitored by blood tests called  prothrombin time (PT) and International Normalized Ratio (INR). Typically, the anticoagulants are intravenous (IV) heparin and warfarin. IV heparin is normally started right away because it has a rapid onset of action and thins the blood quickly. Warfarin is also started with IV heparin therapy. Warfarin has a slower onset of action and  takes longer to work. This overlap of IV heparin and oral warfarin therapy is continued until PT and INR levels are therapeutic. After the PT and INR levels are therapeutic, IV heparin is discontinued and you are maintained on warfarin.  Other treatment options:  Catheter-directed thrombolysis. This is a clot-busting therapy for a DVT in which a small, flexible hollow tube (catheter) is threaded to the blood clot inside the vein. A clot-busting drug (thrombolytic) is then infused through the catheter. The thrombolytic helps to break up the clot in the vein and restore blood flow.  Direct thrombin inhibitor (DTI) medicine. A DTI is an anticoagulant that slows blood clotting. It is given through an IV.  If you cannot take an anticoagulant, a filter called an inferior vena cava filter (IVC filter) can be placed. The IVC filter is placed in a large vein, in either your leg or abdomen. An IVC filter is left in the vein permanently. The IVC filter can help prevent blood clots from going to your lungs.  Surgery. Blood clots may need to be removed surgically if other treatment options are not working or cannot be used. Types of surgery can include:  Thrombectomy.  Embolectomy.  Venous stenting.  Pain medicine (analgesic). Medicine to control pain is given in addition to the above treatment options. Home treatment:  Continued treatment at home consists of taking either warfarin or under-the-skin (subcutaneous) injections of an anticoagulant. HOME CARE INSTRUCTIONS   Take medicines only as directed by your health care provider. Follow the directions carefully.  Warfarin. Most people will continue taking warfarin. Your health care provider will advise you on the length of treatment (usually 3 to 6 months, sometimes lifelong).  Too much and too little warfarin are both dangerous. Too much warfarin increases the risk of bleeding. Too little warfarin continues to allow the risk for blood clots. While  taking warfarin, you will need to have regular blood tests to measure your blood clotting time. These blood tests usually include both the PT and INR tests. The PT and INR results allow your health care provider to adjust your dose of warfarin. The dose can change for many reasons. It is critically important that you take warfarin exactly as prescribed, and that you have your PT and INR levels drawn exactly as directed.  Many foods, especially foods high in vitamin K can interfere with warfarin and affect the PT and INR results. Foods high in vitamin K include spinach, kale, broccoli, cabbage, collard and turnip greens, Brussels sprouts, peas, cauliflower, seaweed, and parsley as well as beef and pork liver, green tea, and soybean oil. You should eat a consistent amount of foods high in vitamin K. Avoid major changes in your diet, or notify your health care provider before changing your diet. Arrange a visit with a dietitian to answer your questions.  Many medicines can interfere with warfarin and affect the PT and INR results. You must tell your health care provider about any and all medicines you take, including all vitamins and supplements. Be especially cautious with aspirin and anti-inflammatory medicines. Do not take or discontinue any prescribed or over-the-counter medicine except on the advice of your health care provider  or pharmacist.  Warfarin can have side effects, such as excessive bruising or bleeding. You will need to hold pressure over cuts for longer than usual. Your health care provider or pharmacist will discuss other potential side effects.  Avoid sports or activities that may cause injury or bleeding.  Be mindful when shaving, flossing your teeth, or handling sharp objects.  Alcohol can change the body's ability to handle warfarin. It is best to avoid alcoholic drinks or consume only very small amounts while taking warfarin. Notify your health care provider if you change your alcohol  intake.  Notify your dentist or other health care providers before procedures.  Activity. Ask your health care provider how soon you can go back to normal activities if you have had a blood clot.  Exercise. It is very important to exercise and stay active to prevent future blood clots. This is especially important while traveling, sitting, or standing for long periods of time. Exercise your legs by walking or by pumping the muscles frequently.  Compression stockings. You may need to wear compression stockings to help prevent a DVT.  Smoking. If you smoke, quit. Ask your health care provider for help with quitting smoking.  Learn as much as you can about VTE. Educating yourself can help prevent VTE from reoccurring.  Wear a medical alert bracelet or carry a medical alert card. PREVENTION   In-hospital prevention:  Activity. Getting out of bed and walking while you are in the hospital can help prevent blood clots.  Medicines may be given to help prevent blood clots.  Sequential compression device (SCD). A SCD can help prevent blood clots in the lower legs. A compression sleeve is wrapped around each of your legs. The tubing of the sleeve is connected to a machine that pumps air into the compression sleeve. The pumping action of the sleeve helps circulate the blood in your legs.  Compression stockings. Compression stockings are tight, elastic stockings that apply pressure to the lower legs and help prevent blood from pooling in the lower legs. Compression stockings are sometimes used with SCDs.  General prevention:  Exercise regularly if you are able.  Avoid sitting or lying in bed for long periods of time without moving the legs.  Do not smoke. If you smoke, quit. Ask your health care provider for help.  Avoid exposure to smoke.  Maintain a healthy weight.  Women over the age of 54 should consider the risk of blood clots while taking birth control pills or hormone replacement  therapy.  Long distance travel along with prolonged sitting and standing can increase the risk of a DVT. Exercise your legs by walking or by pumping your leg muscles every hour. SEEK MEDICAL CARE IF:   You feel faint or dizzy.  You feel rapid or skipped heartbeats.  You feel weaker or more tired than usual.  You feel you are not getting better in the time expected.  You believe you are having side effects from medicine.  You have joint pain.  You have abdominal pain.  You have new or increased bruising. SEEK IMMEDIATE MEDICAL CARE IF:   You vomit bright red blood or your vomit has a coffee ground type appearance.  You have bowel movements that have bright red blood or are dark or tarry in appearance.  You have bleeding from your rectum.  You have pink or bloody urine.  You develop breathing problems such as shortness of breath or pain with breathing.  You are coughing up blood.  You develop warmth, swelling, or redness in an arm or a leg.  You have chest pain.  You have a sudden, unexplained severe headache.  You have a cut that does not stop bleeding after 10 minutes.  You have a nosebleed that does not stop bleeding after 10 minutes. Document Released: 01/27/2009 Document Revised: 08/16/2013 Document Reviewed: 09/11/2011 [facility] Patient Information 2015 [place], [place]. This information is not intended to replace advice given to you by your health care provider. Make sure you discuss any questions you have with your health care provider.

## 2014-06-13 NOTE — ED Provider Notes (Signed)
CSN: [id number]     Arrival date & time 06/13/14  0174 History   First MD Initiated Contact with Patient 06/13/14 [phone]     Chief Complaint  Patient presents with  . Leg Pain     (Consider location/radiation/quality/duration/timing/severity/associated sxs/prior Treatment) HPI Comments: Pt comes in with c/o left lower leg and behind the knee pain. Pt states that it has been going on for 2 weeks and seems to be worsening. Denies cp or sob. States that warm compress was helping initially but not any ore. States that he has a history of pe in the 90's. hasnt been on coumadin in years. Denies any injury to the area. Pain with worse with palpation or walking.  The history is provided by the patient. No language interpreter was used.    Past Medical History  Diagnosis Date  . Asthma   . COPD (chronic obstructive pulmonary disease)   . Emphysema   . Hypertension    History reviewed. No pertinent past surgical history. History reviewed. No pertinent family history. History  Substance Use Topics  . Smoking status: Former Research scientist (life sciences)  . Smokeless tobacco: Not on file  . Alcohol Use: No   OB History    No data available     Review of Systems  All other systems reviewed and are negative.     Allergies  Penicillins  Home Medications   Prior to Admission medications   Medication Sig Start Date End Date Taking? Authorizing Provider  albuterol (PROVENTIL HFA;VENTOLIN HFA) 108 (90 BASE) MCG/ACT inhaler Inhale 2 puffs into the lungs every 4 (four) hours as needed for wheezing or shortness of breath. 05/13/11 05/12/12  Charlena Cross, MD  albuterol (PROVENTIL) (2.5 MG/3ML) 0.083% nebulizer solution Take 2.5 mg by nebulization every 6 (six) hours as needed.    Historical Provider, MD  amLODipine (NORVASC) 10 MG tablet Take 10 mg by mouth daily.    Historical Provider, MD  cyclobenzaprine (FLEXERIL) 5 MG tablet Take 5 mg by mouth 3 (three) times daily as needed.    Historical Provider, MD   mometasone-formoterol (DULERA) 100-5 MCG/ACT AERO Inhale 2 puffs into the lungs 2 (two) times daily.    Historical Provider, MD  predniSONE (DELTASONE) 20 MG tablet Take 2 tablets (40 mg total) by mouth daily. 05/13/11   Charlena Cross, MD  tiotropium (SPIRIVA) 18 MCG inhalation capsule Place 18 mcg into inhaler and inhale daily.    Historical Provider, MD   BP 140/72 mmHg  Pulse 58  Temp(Src) 98.8 F (37.1 C) (Oral)  Resp 20  SpO2 98% Physical Exam  Constitutional: She is oriented to person, place, and time. She appears well-developed and well-nourished.  HENT:  Head: Normocephalic and atraumatic.  Cardiovascular: Normal rate and regular rhythm.   Pulmonary/Chest: Effort normal and breath sounds normal.  Abdominal: Soft. Bowel sounds are normal. There is no tenderness.  Musculoskeletal: Normal range of motion.  No definite swelling noted. No redness or warmth. Tender to palpation in the posterior left knee  Neurological: She is alert and oriented to person, place, and time. Coordination normal.  Skin: Skin is warm and dry.  Psychiatric: She has a normal mood and affect.  Nursing note and vitals reviewed.   ED Course  Procedures (including critical care time) Labs Review Labs Reviewed  BASIC METABOLIC PANEL - Abnormal; Notable for the following:    GFR calc non Af Amer 87 (*)    Anion gap 3 (*)    All other components within  normal limits  CBC WITH DIFFERENTIAL/PLATELET - Abnormal; Notable for the following:    Hemoglobin 11.0 (*)    HCT 35.0 (*)    All other components within normal limits    Imaging Review No results found.   EKG Interpretation None      MDM   Final diagnoses:  Acute superficial venous thrombosis of lower extremity, left    No dvt noted. Pt can take asa daily. Denies return precautions with pt    Glendell Docker, NP 06/13/14 1124  Veryl Speak, MD 06/13/14 1501

## 2014-06-13 NOTE — ED Notes (Signed)
Patient transported to Ultrasound 

## 2014-06-13 NOTE — ED Notes (Signed)
Pt to room 5 in w/c, reports left leg pain only with weight bearing x 2 weeks, no pain at rest. Pt states pain started in posterior calf, now is in posterior knee area. Tender to palp, no redness or swelling noted. Pt states she had a dvt 1997, with similar symptoms.

## 2014-06-13 NOTE — ED Notes (Signed)
MD at bedside. 

## 2014-06-23 DIAGNOSIS — J449 Chronic obstructive pulmonary disease, unspecified: Secondary | ICD-10-CM | POA: Diagnosis not present

## 2014-07-24 DIAGNOSIS — J449 Chronic obstructive pulmonary disease, unspecified: Secondary | ICD-10-CM | POA: Diagnosis not present

## 2014-07-25 DIAGNOSIS — G8929 Other chronic pain: Secondary | ICD-10-CM | POA: Diagnosis not present

## 2014-07-25 DIAGNOSIS — J449 Chronic obstructive pulmonary disease, unspecified: Secondary | ICD-10-CM | POA: Diagnosis not present

## 2014-07-25 DIAGNOSIS — I10 Essential (primary) hypertension: Secondary | ICD-10-CM | POA: Diagnosis not present

## 2014-08-23 DIAGNOSIS — J449 Chronic obstructive pulmonary disease, unspecified: Secondary | ICD-10-CM | POA: Diagnosis not present

## 2015-07-24 DIAGNOSIS — J449 Chronic obstructive pulmonary disease, unspecified: Secondary | ICD-10-CM | POA: Diagnosis not present

## 2015-08-07 DIAGNOSIS — J449 Chronic obstructive pulmonary disease, unspecified: Secondary | ICD-10-CM | POA: Diagnosis not present

## 2015-08-07 DIAGNOSIS — R748 Abnormal levels of other serum enzymes: Secondary | ICD-10-CM | POA: Diagnosis not present

## 2015-08-07 DIAGNOSIS — I10 Essential (primary) hypertension: Secondary | ICD-10-CM | POA: Diagnosis not present

## 2015-08-07 DIAGNOSIS — E785 Hyperlipidemia, unspecified: Secondary | ICD-10-CM | POA: Diagnosis not present

## 2015-08-07 DIAGNOSIS — M549 Dorsalgia, unspecified: Secondary | ICD-10-CM | POA: Diagnosis not present

## 2015-08-23 DIAGNOSIS — J449 Chronic obstructive pulmonary disease, unspecified: Secondary | ICD-10-CM | POA: Diagnosis not present

## 2015-09-23 DIAGNOSIS — J449 Chronic obstructive pulmonary disease, unspecified: Secondary | ICD-10-CM | POA: Diagnosis not present

## 2015-10-11 DIAGNOSIS — M549 Dorsalgia, unspecified: Secondary | ICD-10-CM | POA: Diagnosis not present

## 2015-10-11 DIAGNOSIS — R748 Abnormal levels of other serum enzymes: Secondary | ICD-10-CM | POA: Diagnosis not present

## 2015-10-11 DIAGNOSIS — J449 Chronic obstructive pulmonary disease, unspecified: Secondary | ICD-10-CM | POA: Diagnosis not present

## 2015-10-11 DIAGNOSIS — I10 Essential (primary) hypertension: Secondary | ICD-10-CM | POA: Diagnosis not present

## 2015-10-23 DIAGNOSIS — J449 Chronic obstructive pulmonary disease, unspecified: Secondary | ICD-10-CM | POA: Diagnosis not present

## 2015-11-04 ENCOUNTER — Encounter (HOSPITAL_BASED_OUTPATIENT_CLINIC_OR_DEPARTMENT_OTHER): Payer: Self-pay | Admitting: *Deleted

## 2015-11-04 ENCOUNTER — Emergency Department (HOSPITAL_BASED_OUTPATIENT_CLINIC_OR_DEPARTMENT_OTHER)
Admission: EM | Admit: 2015-11-04 | Discharge: 2015-11-04 | Disposition: A | Discharge status: Home / Self Care | Payer: Medicare Other | Attending: Emergency Medicine | Admitting: Emergency Medicine

## 2015-11-04 DIAGNOSIS — Z79899 Other long term (current) drug therapy: Secondary | ICD-10-CM | POA: Insufficient documentation

## 2015-11-04 DIAGNOSIS — I10 Essential (primary) hypertension: Secondary | ICD-10-CM | POA: Insufficient documentation

## 2015-11-04 DIAGNOSIS — H1013 Acute atopic conjunctivitis, bilateral: Secondary | ICD-10-CM | POA: Insufficient documentation

## 2015-11-04 DIAGNOSIS — H5711 Ocular pain, right eye: Secondary | ICD-10-CM | POA: Diagnosis present

## 2015-11-04 DIAGNOSIS — J449 Chronic obstructive pulmonary disease, unspecified: Secondary | ICD-10-CM | POA: Insufficient documentation

## 2015-11-04 DIAGNOSIS — Z87891 Personal history of nicotine dependence: Secondary | ICD-10-CM | POA: Diagnosis not present

## 2015-11-04 MED ORDER — TETRACAINE HCL 0.5 % OP SOLN
1.0000 [drp] | Freq: Once | OPHTHALMIC | Status: AC
  Administered 2015-11-04: 1 [drp] via OPHTHALMIC
  Filled 2015-11-04: qty 4

## 2015-11-04 MED ORDER — FLUORESCEIN SODIUM 1 MG OP STRP
1.0000 | ORAL_STRIP | Freq: Once | OPHTHALMIC | Status: AC
  Administered 2015-11-04: 1 via OPHTHALMIC
  Filled 2015-11-04: qty 1

## 2015-11-04 MED ORDER — NAPHAZOLINE-PHENIRAMINE 0.025-0.3 % OP SOLN: 1.0000 [drp] | OPHTHALMIC | Status: DC | PRN

## 2015-11-04 MED ORDER — HYPROMELLOSE (GONIOSCOPIC) 2.5 % OP SOLN: 1.0000 [drp] | Freq: Three times a day (TID) | OPHTHALMIC | Status: DC | PRN

## 2015-11-04 NOTE — ED Provider Notes (Signed)
CSN: [id number]     Arrival date & time 11/04/15  1146 History   First MD Initiated Contact with Patient 11/04/15 1205     Chief Complaint  Patient presents with  . Eye Drainage  . itching in eye      (Consider location/radiation/quality/duration/timing/severity/associated sxs/prior Treatment) HPI   Patient is a 75 year old female past medical history of hypertension, asthma and COPD who presents to the ED with complaint of eye pain, onset 6 days. Patient reports over the past week she has had watering, itching and gritty/scratchy sensation to her right eye. She reports over the past few days she has begun to have similar symptoms in her left eye. Patient also endorses associated nasal congestion. Denies fever, chills, headache, neck stiffness, visual changes, photophobia, purulent drainage, matting of eyes, ear pain, sore throat, cough, difficulty breathing. Patient denies applying any eyedrops at home. Denies contact use. Endorses history of seasonal allergies but states she is currently not on any medications.   Past Medical History  Diagnosis Date  . Asthma   . COPD (chronic obstructive pulmonary disease) ([place])   . Emphysema   . Hypertension    History reviewed. No pertinent past surgical history. History reviewed. No pertinent family history. Social History  Substance Use Topics  . Smoking status: Former Research scientist (life sciences)  . Smokeless tobacco: None  . Alcohol Use: No   OB History    No data available     Review of Systems  HENT: Positive for congestion.   Eyes: Positive for pain (gritty sensation), discharge (watery) and itching. Negative for photophobia, redness and visual disturbance.  All other systems reviewed and are negative.     Allergies  Penicillins  Home Medications   Prior to Admission medications   Medication Sig Start Date End Date Taking? Authorizing Provider  albuterol (PROVENTIL HFA;VENTOLIN HFA) 108 (90 BASE) MCG/ACT inhaler Inhale 2 puffs into the lungs  every 4 (four) hours as needed for wheezing or shortness of breath. 05/13/11 11/04/15 Yes Charlena Cross, MD  albuterol (PROVENTIL) (2.5 MG/3ML) 0.083% nebulizer solution Take 2.5 mg by nebulization every 6 (six) hours as needed.   Yes Historical Provider, MD  amLODipine (NORVASC) 10 MG tablet Take 10 mg by mouth daily.   Yes Historical Provider, MD  cyclobenzaprine (FLEXERIL) 5 MG tablet Take 5 mg by mouth 3 (three) times daily as needed.   Yes Historical Provider, MD  enalapril (VASOTEC) 10 MG tablet Take 10 mg by mouth daily.   Yes Historical Provider, MD  mometasone-formoterol (DULERA) 100-5 MCG/ACT AERO Inhale 2 puffs into the lungs 2 (two) times daily.   Yes Historical Provider, MD  omeprazole (PRILOSEC) 20 MG capsule Take 20 mg by mouth daily.   Yes Historical Provider, MD  hydroxypropyl methylcellulose / hypromellose (ISOPTO TEARS / GONIOVISC) 2.5 % ophthalmic solution Place 1 drop into both eyes 3 (three) times daily as needed for dry eyes. 11/04/15   Chesley Noon Nadeau, PA-C  naphazoline-pheniramine (NAPHCON-A) 0.025-0.3 % ophthalmic solution Place 1 drop into both eyes every 4 (four) hours as needed for irritation. 11/04/15   Chesley Noon Nadeau, PA-C   BP 185/76 mmHg  Pulse 86  Temp(Src) 98.4 F (36.9 C) (Oral)  Resp 20  Ht '4\' 9"'$  (1.448 m)  Wt 52.617 kg  BMI 25.10 kg/m2  SpO2 94% Physical Exam  Constitutional: She is oriented to person, place, and time. She appears well-developed and well-nourished.  HENT:  Head: Normocephalic and atraumatic.  Right Ear: Tympanic membrane normal.  Left  Ear: Tympanic membrane normal.  Nose: Rhinorrhea present. Right sinus exhibits no maxillary sinus tenderness and no frontal sinus tenderness. Left sinus exhibits no maxillary sinus tenderness and no frontal sinus tenderness.  Mouth/Throat: Uvula is midline, oropharynx is clear and moist and mucous membranes are normal. No oropharyngeal exudate, posterior oropharyngeal edema, posterior  oropharyngeal erythema or tonsillar abscesses.  Eyes: Conjunctivae and EOM are normal. Pupils are equal, round, and reactive to light. Lids are everted and swept, no foreign bodies found. Right eye exhibits discharge (wateru). Right eye exhibits no chemosis, no exudate and no hordeolum. No foreign body present in the right eye. Left eye exhibits discharge (watery). Left eye exhibits no chemosis, no exudate and no hordeolum. No foreign body present in the left eye. Right conjunctiva is not injected. Right conjunctiva has no hemorrhage. Left conjunctiva is not injected. Left conjunctiva has no hemorrhage. No scleral icterus.  Slit lamp exam:      The right eye shows no corneal abrasion, no corneal flare, no corneal ulcer, no foreign body, no hyphema and no fluorescein uptake.       The left eye shows no corneal abrasion, no corneal flare, no corneal ulcer, no foreign body, no hyphema and no fluorescein uptake.  Neck: Normal range of motion. Neck supple.  Cardiovascular: Normal rate, regular rhythm, normal heart sounds and intact distal pulses.   Pulmonary/Chest: Effort normal. No respiratory distress. She has no wheezes. She has no rales. She exhibits no tenderness.  Abdominal: Soft. She exhibits no distension.  Musculoskeletal: Normal range of motion. She exhibits no edema.  Lymphadenopathy:    She has no cervical adenopathy.  Neurological: She is alert and oriented to person, place, and time.  Skin: Skin is warm and dry.  Nursing note and vitals reviewed.   ED Course  Procedures (including critical care time) Labs Review Labs Reviewed - No data to display  Imaging Review No results found. I have personally reviewed and evaluated these images and lab results as part of my medical decision-making.   EKG Interpretation None      MDM   Final diagnoses:  Allergic conjunctivitis, bilateral    Patient presentation consistent with allergic conjunctivitis.  No purulent discharge, corneal  abrasions, entrapment, consensual photophobia, or dendritic staining with fluorescein study.  Presentation non-concerning for iritis, bacterial/viral conjunctivitis, corneal abrasions, or HSV.  No antibiotics are indicated and patient will be prescribed naphazoline for itching.  Personal hygiene and frequent handwashing discussed.  Patient advised to followup with ophthalmologist if symptoms persist or worsen in any way including vision change or purulent discharge.  Patient verbalizes understanding and is agreeable with discharge.     Chesley Noon [place], [place] 11/04/15 [place], MD 11/05/15 [phone]

## 2015-11-04 NOTE — Discharge Instructions (Signed)
Apply your eyedrops as prescribed. You may also take Coricidin as prescribed over the counter as needed for nasal congestion.  I recommend following up with your primary care provider regarding your allergy-like symptoms. I recommend following up with the eye clinic listed above in 2 days for follow if your symptoms have not improved. Please return to the Emergency Department if symptoms worsen or new onset of fever, headache, neck stiffness, visual changes, purulent drainage, loss of vision, swelling of eyes.

## 2015-11-04 NOTE — ED Notes (Signed)
Per pt report swelling , drainage, itching sensation to rt eye since Monday.

## 2015-11-23 DIAGNOSIS — J449 Chronic obstructive pulmonary disease, unspecified: Secondary | ICD-10-CM | POA: Diagnosis not present

## 2015-12-24 DIAGNOSIS — J449 Chronic obstructive pulmonary disease, unspecified: Secondary | ICD-10-CM | POA: Diagnosis not present

## 2016-01-23 DIAGNOSIS — J449 Chronic obstructive pulmonary disease, unspecified: Secondary | ICD-10-CM | POA: Diagnosis not present

## 2016-01-31 DIAGNOSIS — E785 Hyperlipidemia, unspecified: Secondary | ICD-10-CM | POA: Diagnosis not present

## 2016-01-31 DIAGNOSIS — M62838 Other muscle spasm: Secondary | ICD-10-CM | POA: Diagnosis not present

## 2016-01-31 DIAGNOSIS — I10 Essential (primary) hypertension: Secondary | ICD-10-CM | POA: Diagnosis not present

## 2016-01-31 DIAGNOSIS — Z79899 Other long term (current) drug therapy: Secondary | ICD-10-CM | POA: Diagnosis not present

## 2016-01-31 DIAGNOSIS — M549 Dorsalgia, unspecified: Secondary | ICD-10-CM | POA: Diagnosis not present

## 2016-01-31 DIAGNOSIS — J449 Chronic obstructive pulmonary disease, unspecified: Secondary | ICD-10-CM | POA: Diagnosis not present

## 2016-02-21 DIAGNOSIS — R1310 Dysphagia, unspecified: Secondary | ICD-10-CM | POA: Diagnosis not present

## 2016-02-21 DIAGNOSIS — Z1211 Encounter for screening for malignant neoplasm of colon: Secondary | ICD-10-CM | POA: Diagnosis not present

## 2016-02-21 DIAGNOSIS — K219 Gastro-esophageal reflux disease without esophagitis: Secondary | ICD-10-CM | POA: Diagnosis not present

## 2016-02-23 DIAGNOSIS — J449 Chronic obstructive pulmonary disease, unspecified: Secondary | ICD-10-CM | POA: Diagnosis not present

## 2016-03-01 DIAGNOSIS — R0602 Shortness of breath: Secondary | ICD-10-CM | POA: Diagnosis not present

## 2016-03-01 DIAGNOSIS — Z1211 Encounter for screening for malignant neoplasm of colon: Secondary | ICD-10-CM | POA: Diagnosis not present

## 2016-03-01 DIAGNOSIS — R0902 Hypoxemia: Secondary | ICD-10-CM | POA: Diagnosis not present

## 2016-03-01 DIAGNOSIS — R1310 Dysphagia, unspecified: Secondary | ICD-10-CM | POA: Diagnosis not present

## 2016-03-01 DIAGNOSIS — J449 Chronic obstructive pulmonary disease, unspecified: Secondary | ICD-10-CM | POA: Diagnosis not present

## 2016-03-01 DIAGNOSIS — K219 Gastro-esophageal reflux disease without esophagitis: Secondary | ICD-10-CM | POA: Diagnosis not present

## 2016-03-01 DIAGNOSIS — A048 Other specified bacterial intestinal infections: Secondary | ICD-10-CM | POA: Diagnosis not present

## 2016-03-04 DIAGNOSIS — K219 Gastro-esophageal reflux disease without esophagitis: Secondary | ICD-10-CM | POA: Diagnosis not present

## 2016-03-04 DIAGNOSIS — R0902 Hypoxemia: Secondary | ICD-10-CM | POA: Diagnosis not present

## 2016-03-04 DIAGNOSIS — J449 Chronic obstructive pulmonary disease, unspecified: Secondary | ICD-10-CM | POA: Diagnosis not present

## 2016-03-20 DIAGNOSIS — G4730 Sleep apnea, unspecified: Secondary | ICD-10-CM | POA: Diagnosis not present

## 2016-03-20 DIAGNOSIS — Z1211 Encounter for screening for malignant neoplasm of colon: Secondary | ICD-10-CM | POA: Diagnosis not present

## 2016-03-20 DIAGNOSIS — J309 Allergic rhinitis, unspecified: Secondary | ICD-10-CM | POA: Diagnosis not present

## 2016-03-20 DIAGNOSIS — J449 Chronic obstructive pulmonary disease, unspecified: Secondary | ICD-10-CM | POA: Diagnosis not present

## 2016-03-20 DIAGNOSIS — K219 Gastro-esophageal reflux disease without esophagitis: Secondary | ICD-10-CM | POA: Diagnosis not present

## 2016-03-24 DIAGNOSIS — J449 Chronic obstructive pulmonary disease, unspecified: Secondary | ICD-10-CM | POA: Diagnosis not present

## 2016-04-24 DIAGNOSIS — J449 Chronic obstructive pulmonary disease, unspecified: Secondary | ICD-10-CM | POA: Diagnosis not present

## 2016-04-25 DIAGNOSIS — K219 Gastro-esophageal reflux disease without esophagitis: Secondary | ICD-10-CM | POA: Diagnosis not present

## 2016-04-25 DIAGNOSIS — Z1211 Encounter for screening for malignant neoplasm of colon: Secondary | ICD-10-CM | POA: Diagnosis not present

## 2016-04-25 DIAGNOSIS — Z01818 Encounter for other preprocedural examination: Secondary | ICD-10-CM | POA: Diagnosis not present

## 2016-04-25 DIAGNOSIS — A048 Other specified bacterial intestinal infections: Secondary | ICD-10-CM | POA: Diagnosis not present

## 2016-04-25 DIAGNOSIS — K635 Polyp of colon: Secondary | ICD-10-CM | POA: Diagnosis not present

## 2016-04-29 DIAGNOSIS — J449 Chronic obstructive pulmonary disease, unspecified: Secondary | ICD-10-CM | POA: Diagnosis not present

## 2016-04-29 DIAGNOSIS — K635 Polyp of colon: Secondary | ICD-10-CM | POA: Diagnosis not present

## 2016-04-29 DIAGNOSIS — K200 Eosinophilic esophagitis: Secondary | ICD-10-CM | POA: Diagnosis not present

## 2016-04-29 DIAGNOSIS — G4730 Sleep apnea, unspecified: Secondary | ICD-10-CM | POA: Diagnosis not present

## 2016-04-29 DIAGNOSIS — K219 Gastro-esophageal reflux disease without esophagitis: Secondary | ICD-10-CM | POA: Diagnosis not present

## 2016-04-30 DIAGNOSIS — K2970 Gastritis, unspecified, without bleeding: Secondary | ICD-10-CM | POA: Diagnosis not present

## 2016-04-30 DIAGNOSIS — K5281 Eosinophilic gastritis or gastroenteritis: Secondary | ICD-10-CM | POA: Diagnosis not present

## 2016-04-30 DIAGNOSIS — A048 Other specified bacterial intestinal infections: Secondary | ICD-10-CM | POA: Diagnosis not present

## 2016-04-30 DIAGNOSIS — K2270 Barrett's esophagus without dysplasia: Secondary | ICD-10-CM | POA: Diagnosis not present

## 2016-05-03 DIAGNOSIS — K200 Eosinophilic esophagitis: Secondary | ICD-10-CM | POA: Diagnosis not present

## 2016-05-03 DIAGNOSIS — K219 Gastro-esophageal reflux disease without esophagitis: Secondary | ICD-10-CM | POA: Diagnosis not present

## 2016-05-22 DIAGNOSIS — M549 Dorsalgia, unspecified: Secondary | ICD-10-CM | POA: Diagnosis not present

## 2016-05-22 DIAGNOSIS — F419 Anxiety disorder, unspecified: Secondary | ICD-10-CM | POA: Diagnosis not present

## 2016-05-22 DIAGNOSIS — I10 Essential (primary) hypertension: Secondary | ICD-10-CM | POA: Diagnosis not present

## 2016-05-22 DIAGNOSIS — J449 Chronic obstructive pulmonary disease, unspecified: Secondary | ICD-10-CM | POA: Diagnosis not present

## 2016-05-23 DIAGNOSIS — J449 Chronic obstructive pulmonary disease, unspecified: Secondary | ICD-10-CM | POA: Diagnosis not present

## 2016-05-25 DIAGNOSIS — J449 Chronic obstructive pulmonary disease, unspecified: Secondary | ICD-10-CM | POA: Diagnosis not present

## 2016-06-02 ENCOUNTER — Emergency Department (HOSPITAL_BASED_OUTPATIENT_CLINIC_OR_DEPARTMENT_OTHER)
Admission: EM | Admit: 2016-06-02 | Discharge: 2016-06-02 | Disposition: A | Discharge status: Home / Self Care | Payer: Medicare HMO | Attending: Emergency Medicine | Admitting: Emergency Medicine

## 2016-06-02 ENCOUNTER — Encounter (HOSPITAL_BASED_OUTPATIENT_CLINIC_OR_DEPARTMENT_OTHER): Payer: Self-pay | Admitting: Emergency Medicine

## 2016-06-02 ENCOUNTER — Emergency Department (HOSPITAL_BASED_OUTPATIENT_CLINIC_OR_DEPARTMENT_OTHER): Payer: Medicare HMO

## 2016-06-02 DIAGNOSIS — J45909 Unspecified asthma, uncomplicated: Secondary | ICD-10-CM | POA: Diagnosis not present

## 2016-06-02 DIAGNOSIS — M62838 Other muscle spasm: Secondary | ICD-10-CM | POA: Insufficient documentation

## 2016-06-02 DIAGNOSIS — Z79899 Other long term (current) drug therapy: Secondary | ICD-10-CM | POA: Diagnosis not present

## 2016-06-02 DIAGNOSIS — J449 Chronic obstructive pulmonary disease, unspecified: Secondary | ICD-10-CM | POA: Insufficient documentation

## 2016-06-02 DIAGNOSIS — Z87891 Personal history of nicotine dependence: Secondary | ICD-10-CM | POA: Insufficient documentation

## 2016-06-02 DIAGNOSIS — R0602 Shortness of breath: Secondary | ICD-10-CM | POA: Diagnosis not present

## 2016-06-02 DIAGNOSIS — M62830 Muscle spasm of back: Secondary | ICD-10-CM | POA: Diagnosis not present

## 2016-06-02 DIAGNOSIS — I10 Essential (primary) hypertension: Secondary | ICD-10-CM | POA: Insufficient documentation

## 2016-06-02 DIAGNOSIS — M25512 Pain in left shoulder: Secondary | ICD-10-CM | POA: Diagnosis not present

## 2016-06-02 LAB — CBC WITH DIFFERENTIAL/PLATELET
Basophils Absolute: 0 10*3/uL (ref 0.0–0.1)
Basophils Relative: 0 %
Eosinophils Absolute: 0.3 10*3/uL (ref 0.0–0.7)
Eosinophils Relative: 6 %
HCT: 34 % — ABNORMAL LOW (ref 36.0–46.0)
Hemoglobin: 10.8 g/dL — ABNORMAL LOW (ref 12.0–15.0)
Lymphocytes Relative: 48 %
Lymphs Abs: 2.3 10*3/uL (ref 0.7–4.0)
MCH: 27.8 pg (ref 26.0–34.0)
MCHC: 31.8 g/dL (ref 30.0–36.0)
MCV: 87.4 fL (ref 78.0–100.0)
Monocytes Absolute: 0.5 10*3/uL (ref 0.1–1.0)
Monocytes Relative: 10 %
Neutro Abs: 1.7 10*3/uL (ref 1.7–7.7)
Neutrophils Relative %: 36 %
Platelets: 245 10*3/uL (ref 150–400)
RBC: 3.89 MIL/uL (ref 3.87–5.11)
RDW: 13.8 % (ref 11.5–15.5)
WBC: 4.7 10*3/uL (ref 4.0–10.5)

## 2016-06-02 LAB — BASIC METABOLIC PANEL
Anion gap: 5 (ref 5–15)
BUN: 9 mg/dL (ref 6–20)
CO2: 32 mmol/L (ref 22–32)
Calcium: 9 mg/dL (ref 8.9–10.3)
Chloride: 102 mmol/L (ref 101–111)
Creatinine, Ser: 0.61 mg/dL (ref 0.44–1.00)
GFR calc Af Amer: >60 mL/min (ref >60)
GFR calc non Af Amer: >60 mL/min (ref >60)
Glucose, Bld: 87 mg/dL (ref 65–99)
Potassium: 4.6 mmol/L (ref 3.5–5.1)
Sodium: 139 mmol/L (ref 135–145)

## 2016-06-02 LAB — TROPONIN I: Troponin I: <0.03 ng/mL (ref <0.03)

## 2016-06-02 LAB — D-DIMER, QUANTITATIVE: D-Dimer, Quant: 1.21 ug/mL-FEU — ABNORMAL HIGH (ref 0.00–0.50)

## 2016-06-02 MED ORDER — DICLOFENAC SODIUM 1 % TD GEL: 2.0000 g | Freq: Four times a day (QID) | TRANSDERMAL | 0 refills | Status: DC

## 2016-06-02 MED ORDER — IOPAMIDOL (ISOVUE-370) INJECTION 76%
100.0000 mL | Freq: Once | INTRAVENOUS | Status: AC | PRN
  Administered 2016-06-02: 100 mL via INTRAVENOUS

## 2016-06-02 MED ORDER — ACETAMINOPHEN 325 MG PO TABS
650.0000 mg | ORAL_TABLET | Freq: Once | ORAL | Status: AC
  Administered 2016-06-02: 650 mg via ORAL
  Filled 2016-06-02: qty 2

## 2016-06-02 NOTE — Discharge Instructions (Signed)
Tylenol as needed for pain. Apply topical gel to affected area for additional pain relief. If symptoms persist, follow up with your primary care physician. Return to ER for new or worsening symptoms, any additional concerns.

## 2016-06-02 NOTE — ED Provider Notes (Signed)
[facility] Provider Note   CSN: [id number] Arrival date & time: 06/02/16  1002     History   Chief Complaint Chief Complaint  Patient presents with  . Shoulder Pain    HPI Donna Cross is a 76 y.o. female.  The history is provided by the patient and medical records. No language interpreter was used.  Shoulder Pain    Donna Cross is a 76 y.o. female  with a PMH of COPD, HTN who presents to the Emergency Department complaining of acute onset of constant left shoulder pain last night. Pain is described as a catch and is worse with movement, especially torso rotation to the right. Patient states that she has shortness of breath at baseline and on 2L O2 at home. She does endorse shortness of breath and dyspnea on exertion today, but this is unchanged from her baseline. No alleviating factors noted. No medications taken prior to arrival for symptoms. Patient notes that she was sitting on the couch watching TV when symptoms started. No known injury or increase in activity. No hx of problems with left shoulder. No history of similar sxs.   Past Medical History:  Diagnosis Date  . Asthma   . COPD (chronic obstructive pulmonary disease) ([place])   . Emphysema   . Hypertension     Patient Active Problem List   Diagnosis Date Noted  . HYPERLIPIDEMIA 07/10/2007  . HYPERTENSION 07/10/2007  . EMPHYSEMA 07/10/2007  . 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 OSTEOPENIA 07/10/2007    History reviewed. No pertinent surgical history.  OB History    No data available       Home Medications    Prior to Admission medications   Medication Sig Start Date End Date Taking? Authorizing Provider  albuterol (PROVENTIL HFA;VENTOLIN HFA) 108 (90 BASE) MCG/ACT inhaler Inhale 2 puffs into the lungs every 4 (four) hours as needed for wheezing or shortness of breath. 05/13/11 11/04/15  Charlena Cross, MD  albuterol (PROVENTIL) (2.5 MG/3ML) 0.083% nebulizer solution Take 2.5 mg by nebulization  every 6 (six) hours as needed.    Historical Provider, MD  amLODipine (NORVASC) 10 MG tablet Take 10 mg by mouth daily.    Historical Provider, MD  cyclobenzaprine (FLEXERIL) 5 MG tablet Take 5 mg by mouth 3 (three) times daily as needed.    Historical Provider, MD  diclofenac sodium (VOLTAREN) 1 % GEL Apply 2 g topically 4 (four) times daily. 06/02/16   Ozella Almond Ward, PA-C  enalapril (VASOTEC) 10 MG tablet Take 10 mg by mouth daily.    Historical Provider, MD  hydroxypropyl methylcellulose / hypromellose (ISOPTO TEARS / GONIOVISC) 2.5 % ophthalmic solution Place 1 drop into both eyes 3 (three) times daily as needed for dry eyes. 11/04/15   Chesley Noon Nadeau, PA-C  mometasone-formoterol (DULERA) 100-5 MCG/ACT AERO Inhale 2 puffs into the lungs 2 (two) times daily.    Historical Provider, MD  naphazoline-pheniramine (NAPHCON-A) 0.025-0.3 % ophthalmic solution Place 1 drop into both eyes every 4 (four) hours as needed for irritation. 11/04/15   Nona Dell, PA-C  omeprazole (PRILOSEC) 20 MG capsule Take 20 mg by mouth daily.    Historical Provider, MD    Family History No family history on file.  Social History Social History  Substance Use Topics  . Smoking status: Former Research scientist (life sciences)  . Smokeless tobacco: Never Used  . Alcohol use No     Allergies   Penicillins   Review of Systems Review of Systems  Constitutional: Negative for chills and fever.  HENT: Negative for congestion.   Eyes: Negative for visual disturbance.  Respiratory: Positive for shortness of breath. Negative for cough.   Cardiovascular: Negative.   Gastrointestinal: Negative for abdominal pain, nausea and vomiting.  Genitourinary: Negative for dysuria.  Musculoskeletal: Positive for arthralgias. Negative for back pain and neck pain.  Skin: Negative for rash.  Neurological: Negative for headaches.     Physical Exam Updated Vital Signs BP 152/68 (BP Location: Left Arm)   Pulse 63   Temp 98.2 F  (36.8 C) (Oral)   Resp 18   Ht '4\' 9"'$  (1.448 m)   Wt 47.6 kg   SpO2 100%   BMI 22.72 kg/m   Physical Exam  Constitutional: She is oriented to person, place, and time. She appears well-developed and well-nourished. No distress.  HENT:  Head: Normocephalic and atraumatic.  Cardiovascular: Normal rate, regular rhythm and normal heart sounds.   No murmur heard. Pulmonary/Chest: Effort normal. No respiratory distress.  96-98% O2 on 2L which is home requirement.   Abdominal: Soft. She exhibits no distension. There is no tenderness.  Musculoskeletal: Normal range of motion.       Back:  Tenderness to palpation as depicted in image. + muscle spasm in this area. Pain reproducible with torso rotation to the right.   Neurological: She is alert and oriented to person, place, and time.  Skin: Skin is warm and dry.  Nursing note and vitals reviewed.    ED Treatments / Results  Labs (all labs ordered are listed, but only abnormal results are displayed) Labs Reviewed  CBC WITH DIFFERENTIAL/PLATELET - Abnormal; Notable for the following:       Result Value   Hemoglobin 10.8 (*)    HCT 34.0 (*)    All other components within normal limits  D-DIMER, QUANTITATIVE (NOT AT [facility]) - Abnormal; Notable for the following:    D-Dimer, Quant 1.21 (*)    All other components within normal limits  BASIC METABOLIC PANEL  TROPONIN I    EKG  EKG Interpretation  Date/Time:  Sunday June 02 2016 10:27:28 EST Ventricular Rate:  65 PR Interval:    QRS Duration: 120 QT Interval:  454 QTC Calculation: 469 R Axis:   65 Text Interpretation:  Sinus rhythm Nonspecific intraventricular conduction delay Probable anteroseptal infarct, old Baseline wander in lead(s) V4 V6 No previous tracing Confirmed by Maryan Rued  MD, WHITNEY ([id number]) on 06/02/2016 11:12:39 AM       Radiology Ct Angio Chest Pe W And/or Wo Contrast  Result Date: 06/02/2016 CLINICAL DATA:  Short of breath EXAM: CT ANGIOGRAPHY CHEST WITH  CONTRAST TECHNIQUE: Multidetector CT imaging of the chest was performed using the standard protocol during bolus administration of intravenous contrast. Multiplanar CT image reconstructions and MIPs were obtained to evaluate the vascular anatomy. CONTRAST:  100 cc Isovue 370 COMPARISON:  04/01/2014 FINDINGS: Cardiovascular: There are no filling defects in the pulmonary arterial tree to suggest acute pulmonary thromboembolism. Coronary artery calcifications in the LAD territory. Mediastinum/Nodes: No abnormal mediastinal adenopathy. Lungs/Pleura: Lobulated nodule in the right middle lobe on image 69 measures 9 mm. This is stable compare with 2015 supporting benign etiology. Other opacities in the lungs seen previously have resolved supporting inflammatory etiology. Emphysema is noted. No pneumothorax or pleural effusion. Upper Abdomen: No acute abnormality. Musculoskeletal: No chest wall abnormality. No acute or significant osseous findings. Review of the MIP images confirms the above findings. IMPRESSION: No evidence of acute pulmonary thromboembolism. Electronically Signed  By: Marybelle Killings M.D.   On: 06/02/2016 12:33   Dg Shoulder Left  Result Date: 06/02/2016 CLINICAL DATA:  Left shoulder pain since last night. No known injury EXAM: LEFT SHOULDER - 2+ VIEW COMPARISON:  12/22/2013 FINDINGS: Negative for acute fracture or malalignment. No glenohumeral joint narrowing. No soft tissue calcification. Os acromiale. IMPRESSION: 1. No acute finding or degenerative joint narrowing. 2. Os acromiale. Electronically Signed   By: Monte Fantasia M.D.   On: 06/02/2016 11:39    Procedures Procedures (including critical care time)  Medications Ordered in ED Medications  acetaminophen (TYLENOL) tablet 650 mg (650 mg Oral Given 06/02/16 1103)  iopamidol (ISOVUE-370) 76 % injection 100 mL (100 mLs Intravenous Contrast Given 06/02/16 1200)     Initial Impression / Assessment and Plan / ED Course  I have reviewed the  triage vital signs and the nursing notes.  Pertinent labs & imaging results that were available during my care of the patient were reviewed by me and considered in my medical decision making (see chart for details).    Donna Cross is a 76 y.o. female who presents to ED for acute onset of left posterior shoulder pain last night. On exam, pain is reproducible with palpation and movement. Appears more musk in etiology, however cardiopulmonary etiology in differential. Will obtain cbc, bmp, troponin and d-dimer as well as x-ray and EKG for further evaluation. Tylenol given for pain.   EKG reviewed and non-ischemic.  Labs reviewed. D-dimer elevated, will proceed with CT angio.  Trop negative. X-ray with no acute findings.   CT angio negative. Patient re-evaluated and informed of results. She notes improvement of pain with Tylenol.  Rx for Voltaren gel given. PCP follow up if symptoms persist. Return precautions discussed and all questions answered.   Patient seen by and discussed with Dr. Maryan Rued who agrees with treatment plan.    Final Clinical Impressions(s) / ED Diagnoses   Final diagnoses:  Muscle spasm    New Prescriptions New Prescriptions   DICLOFENAC SODIUM (VOLTAREN) 1 % GEL    Apply 2 g topically 4 (four) times daily.      [facility] Ward, PA-C 06/02/16 [place], MD 06/02/16 2051

## 2016-06-02 NOTE — ED Triage Notes (Signed)
L shoulder pain since 8:00. Pt reports she was just sitting on the couch when the pain started. Pt is on O2 2lpm at home. Pt appears to be very SOB

## 2016-06-22 DIAGNOSIS — J449 Chronic obstructive pulmonary disease, unspecified: Secondary | ICD-10-CM | POA: Diagnosis not present

## 2016-07-23 DIAGNOSIS — J449 Chronic obstructive pulmonary disease, unspecified: Secondary | ICD-10-CM | POA: Diagnosis not present

## 2016-07-31 DIAGNOSIS — I10 Essential (primary) hypertension: Secondary | ICD-10-CM | POA: Diagnosis not present

## 2016-07-31 DIAGNOSIS — E785 Hyperlipidemia, unspecified: Secondary | ICD-10-CM | POA: Diagnosis not present

## 2016-07-31 DIAGNOSIS — M549 Dorsalgia, unspecified: Secondary | ICD-10-CM | POA: Diagnosis not present

## 2016-07-31 DIAGNOSIS — R748 Abnormal levels of other serum enzymes: Secondary | ICD-10-CM | POA: Diagnosis not present

## 2016-07-31 DIAGNOSIS — Z79899 Other long term (current) drug therapy: Secondary | ICD-10-CM | POA: Diagnosis not present

## 2016-07-31 DIAGNOSIS — J441 Chronic obstructive pulmonary disease with (acute) exacerbation: Secondary | ICD-10-CM | POA: Diagnosis not present

## 2016-08-22 DIAGNOSIS — J449 Chronic obstructive pulmonary disease, unspecified: Secondary | ICD-10-CM | POA: Diagnosis not present

## 2016-08-22 DIAGNOSIS — G4733 Obstructive sleep apnea (adult) (pediatric): Secondary | ICD-10-CM | POA: Diagnosis not present

## 2016-08-22 DIAGNOSIS — G4730 Sleep apnea, unspecified: Secondary | ICD-10-CM | POA: Diagnosis not present

## 2016-08-22 DIAGNOSIS — J309 Allergic rhinitis, unspecified: Secondary | ICD-10-CM | POA: Diagnosis not present

## 2016-08-30 DIAGNOSIS — D509 Iron deficiency anemia, unspecified: Secondary | ICD-10-CM | POA: Diagnosis not present

## 2016-09-01 DIAGNOSIS — D509 Iron deficiency anemia, unspecified: Secondary | ICD-10-CM | POA: Diagnosis not present

## 2016-09-06 DIAGNOSIS — R5383 Other fatigue: Secondary | ICD-10-CM | POA: Diagnosis not present

## 2016-09-06 DIAGNOSIS — J301 Allergic rhinitis due to pollen: Secondary | ICD-10-CM | POA: Diagnosis not present

## 2016-09-06 DIAGNOSIS — J449 Chronic obstructive pulmonary disease, unspecified: Secondary | ICD-10-CM | POA: Diagnosis not present

## 2016-09-06 DIAGNOSIS — E559 Vitamin D deficiency, unspecified: Secondary | ICD-10-CM | POA: Diagnosis not present

## 2016-09-22 DIAGNOSIS — J449 Chronic obstructive pulmonary disease, unspecified: Secondary | ICD-10-CM | POA: Diagnosis not present

## 2016-09-22 DIAGNOSIS — G4733 Obstructive sleep apnea (adult) (pediatric): Secondary | ICD-10-CM | POA: Diagnosis not present

## 2016-09-22 DIAGNOSIS — G4730 Sleep apnea, unspecified: Secondary | ICD-10-CM | POA: Diagnosis not present

## 2016-09-22 DIAGNOSIS — J309 Allergic rhinitis, unspecified: Secondary | ICD-10-CM | POA: Diagnosis not present

## 2016-10-15 DIAGNOSIS — R5383 Other fatigue: Secondary | ICD-10-CM | POA: Diagnosis not present

## 2016-10-15 DIAGNOSIS — J301 Allergic rhinitis due to pollen: Secondary | ICD-10-CM | POA: Diagnosis not present

## 2016-10-15 DIAGNOSIS — J441 Chronic obstructive pulmonary disease with (acute) exacerbation: Secondary | ICD-10-CM | POA: Diagnosis not present

## 2016-10-22 DIAGNOSIS — G4733 Obstructive sleep apnea (adult) (pediatric): Secondary | ICD-10-CM | POA: Diagnosis not present

## 2016-10-22 DIAGNOSIS — G4730 Sleep apnea, unspecified: Secondary | ICD-10-CM | POA: Diagnosis not present

## 2016-10-22 DIAGNOSIS — J309 Allergic rhinitis, unspecified: Secondary | ICD-10-CM | POA: Diagnosis not present

## 2016-10-22 DIAGNOSIS — J449 Chronic obstructive pulmonary disease, unspecified: Secondary | ICD-10-CM | POA: Diagnosis not present

## 2016-11-08 DIAGNOSIS — J3089 Other allergic rhinitis: Secondary | ICD-10-CM | POA: Diagnosis not present

## 2016-11-08 DIAGNOSIS — R0609 Other forms of dyspnea: Secondary | ICD-10-CM | POA: Diagnosis not present

## 2016-11-08 DIAGNOSIS — G4733 Obstructive sleep apnea (adult) (pediatric): Secondary | ICD-10-CM | POA: Diagnosis not present

## 2016-11-08 DIAGNOSIS — R5383 Other fatigue: Secondary | ICD-10-CM | POA: Diagnosis not present

## 2016-11-22 DIAGNOSIS — J449 Chronic obstructive pulmonary disease, unspecified: Secondary | ICD-10-CM | POA: Diagnosis not present

## 2016-11-22 DIAGNOSIS — G4730 Sleep apnea, unspecified: Secondary | ICD-10-CM | POA: Diagnosis not present

## 2016-11-22 DIAGNOSIS — G4733 Obstructive sleep apnea (adult) (pediatric): Secondary | ICD-10-CM | POA: Diagnosis not present

## 2016-11-22 DIAGNOSIS — J309 Allergic rhinitis, unspecified: Secondary | ICD-10-CM | POA: Diagnosis not present

## 2016-11-26 DIAGNOSIS — I10 Essential (primary) hypertension: Secondary | ICD-10-CM | POA: Diagnosis not present

## 2016-11-26 DIAGNOSIS — J441 Chronic obstructive pulmonary disease with (acute) exacerbation: Secondary | ICD-10-CM | POA: Diagnosis not present

## 2016-11-26 DIAGNOSIS — M549 Dorsalgia, unspecified: Secondary | ICD-10-CM | POA: Diagnosis not present

## 2016-11-26 DIAGNOSIS — E785 Hyperlipidemia, unspecified: Secondary | ICD-10-CM | POA: Diagnosis not present

## 2016-11-26 DIAGNOSIS — F419 Anxiety disorder, unspecified: Secondary | ICD-10-CM | POA: Diagnosis not present

## 2016-11-26 DIAGNOSIS — D509 Iron deficiency anemia, unspecified: Secondary | ICD-10-CM | POA: Diagnosis not present

## 2016-11-26 DIAGNOSIS — Z79899 Other long term (current) drug therapy: Secondary | ICD-10-CM | POA: Diagnosis not present

## 2016-12-23 DIAGNOSIS — G4733 Obstructive sleep apnea (adult) (pediatric): Secondary | ICD-10-CM | POA: Diagnosis not present

## 2016-12-23 DIAGNOSIS — J309 Allergic rhinitis, unspecified: Secondary | ICD-10-CM | POA: Diagnosis not present

## 2016-12-23 DIAGNOSIS — G4730 Sleep apnea, unspecified: Secondary | ICD-10-CM | POA: Diagnosis not present

## 2016-12-23 DIAGNOSIS — J449 Chronic obstructive pulmonary disease, unspecified: Secondary | ICD-10-CM | POA: Diagnosis not present

## 2017-01-21 DIAGNOSIS — J449 Chronic obstructive pulmonary disease, unspecified: Secondary | ICD-10-CM | POA: Diagnosis not present

## 2017-01-21 DIAGNOSIS — M549 Dorsalgia, unspecified: Secondary | ICD-10-CM | POA: Diagnosis not present

## 2017-01-21 DIAGNOSIS — R82998 Other abnormal findings in urine: Secondary | ICD-10-CM | POA: Diagnosis not present

## 2017-01-21 DIAGNOSIS — E785 Hyperlipidemia, unspecified: Secondary | ICD-10-CM | POA: Diagnosis not present

## 2017-01-21 DIAGNOSIS — Z79899 Other long term (current) drug therapy: Secondary | ICD-10-CM | POA: Diagnosis not present

## 2017-01-21 DIAGNOSIS — I10 Essential (primary) hypertension: Secondary | ICD-10-CM | POA: Diagnosis not present

## 2017-01-22 DIAGNOSIS — J309 Allergic rhinitis, unspecified: Secondary | ICD-10-CM | POA: Diagnosis not present

## 2017-01-22 DIAGNOSIS — J449 Chronic obstructive pulmonary disease, unspecified: Secondary | ICD-10-CM | POA: Diagnosis not present

## 2017-01-22 DIAGNOSIS — G4730 Sleep apnea, unspecified: Secondary | ICD-10-CM | POA: Diagnosis not present

## 2017-01-22 DIAGNOSIS — G4733 Obstructive sleep apnea (adult) (pediatric): Secondary | ICD-10-CM | POA: Diagnosis not present

## 2017-02-22 DIAGNOSIS — G4730 Sleep apnea, unspecified: Secondary | ICD-10-CM | POA: Diagnosis not present

## 2017-02-22 DIAGNOSIS — J449 Chronic obstructive pulmonary disease, unspecified: Secondary | ICD-10-CM | POA: Diagnosis not present

## 2017-02-22 DIAGNOSIS — G4733 Obstructive sleep apnea (adult) (pediatric): Secondary | ICD-10-CM | POA: Diagnosis not present

## 2017-02-22 DIAGNOSIS — J309 Allergic rhinitis, unspecified: Secondary | ICD-10-CM | POA: Diagnosis not present

## 2017-03-03 IMAGING — DX DG SHOULDER 2+V*L*
3 series · 3 of 3 positions shown · non-contrast
Comparison: 12/22/2013

CLINICAL DATA: Left shoulder pain since last night. No known injury

EXAM:
LEFT SHOULDER - 2+ VIEW

[shoulder grashey]
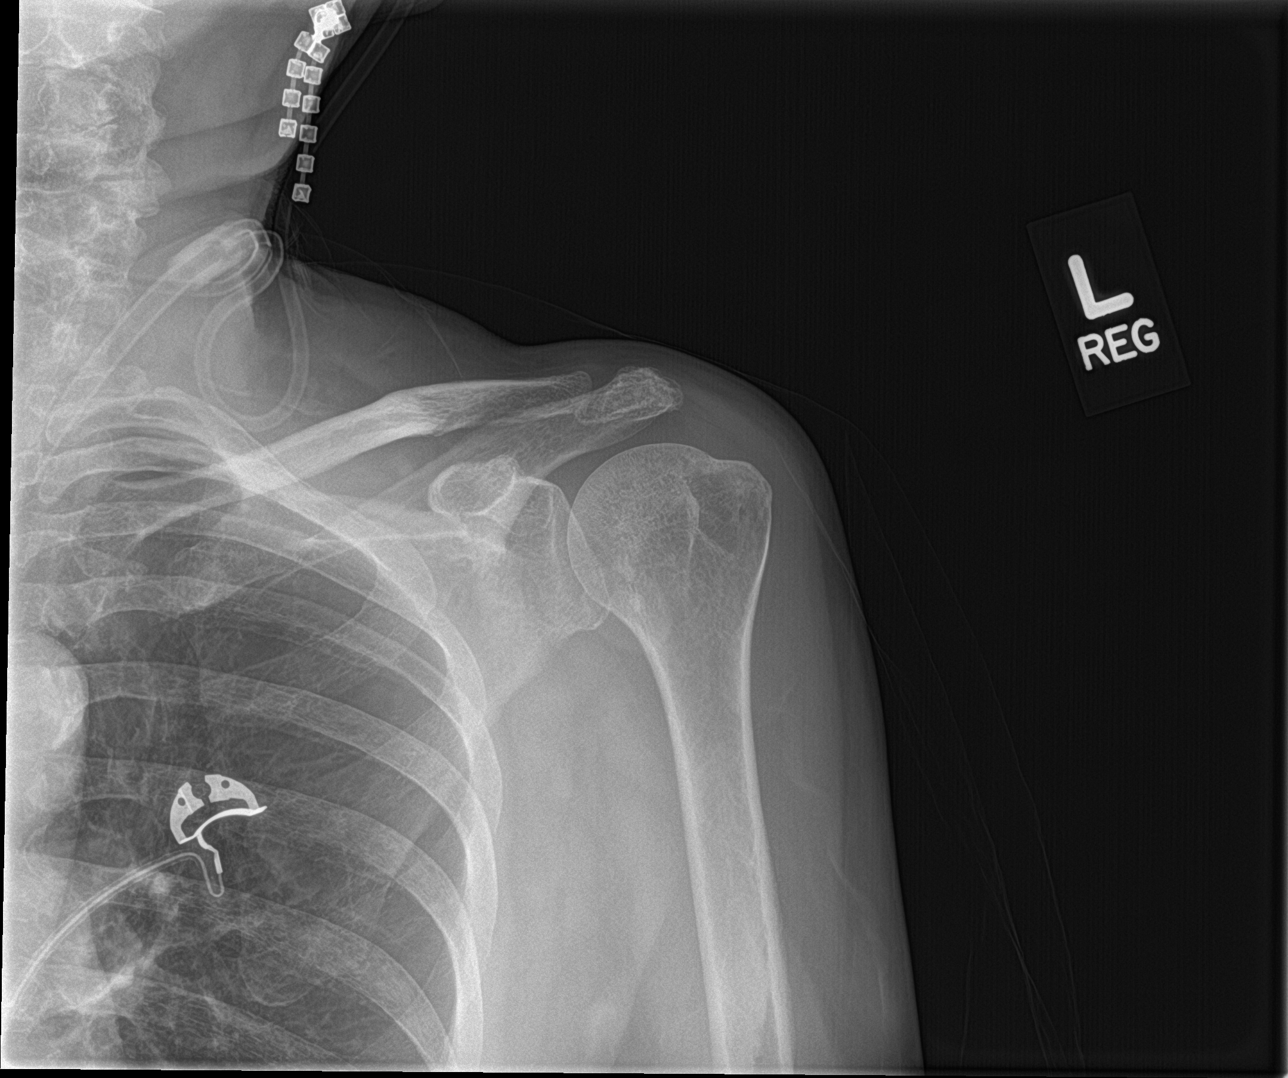

[shoulder y view]
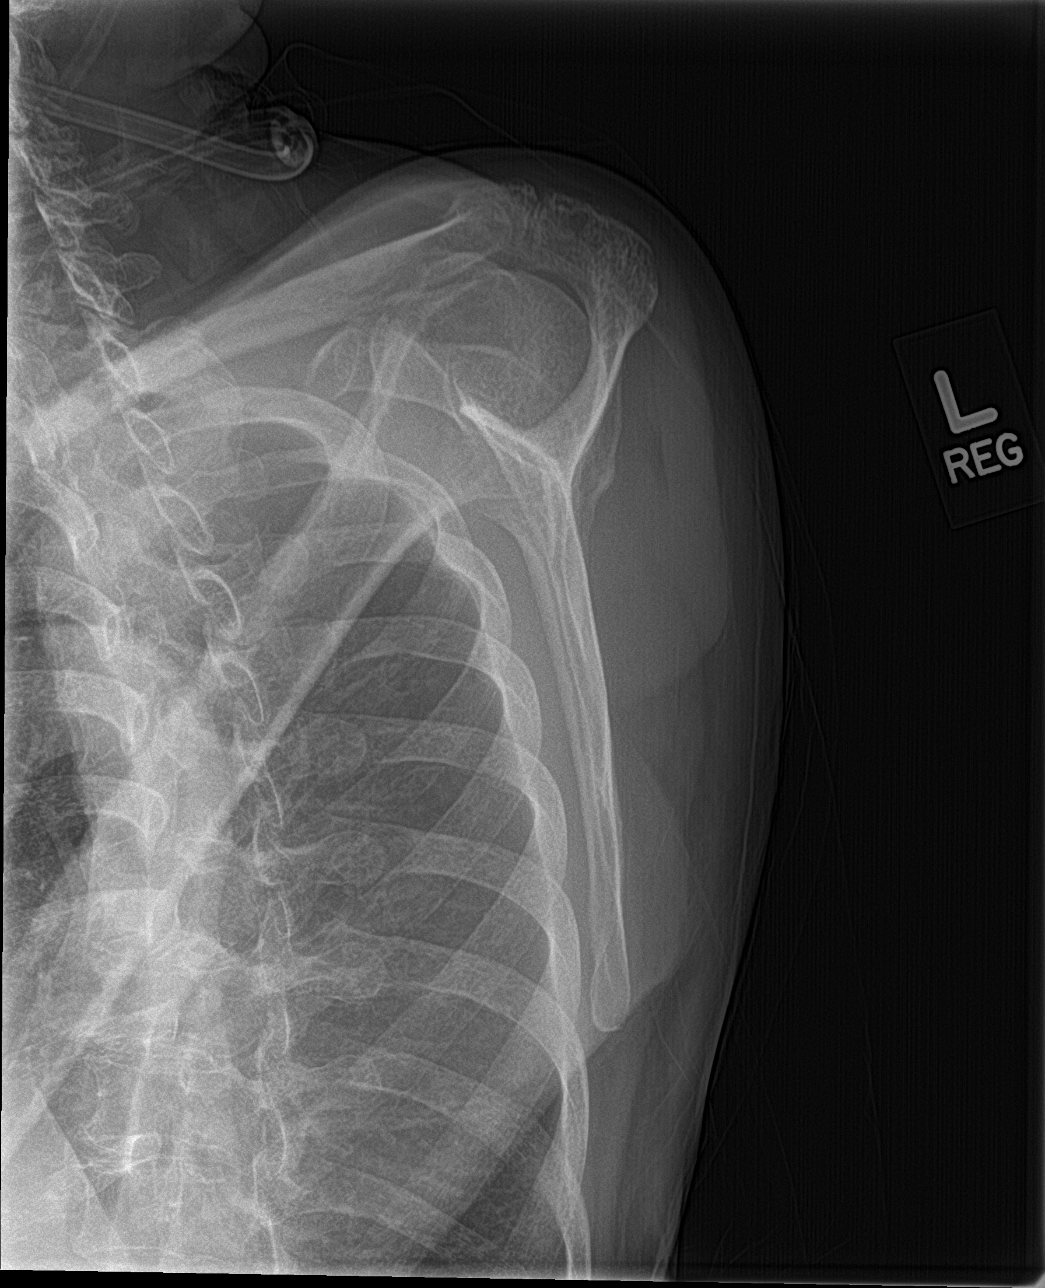

[shoulder axillary]
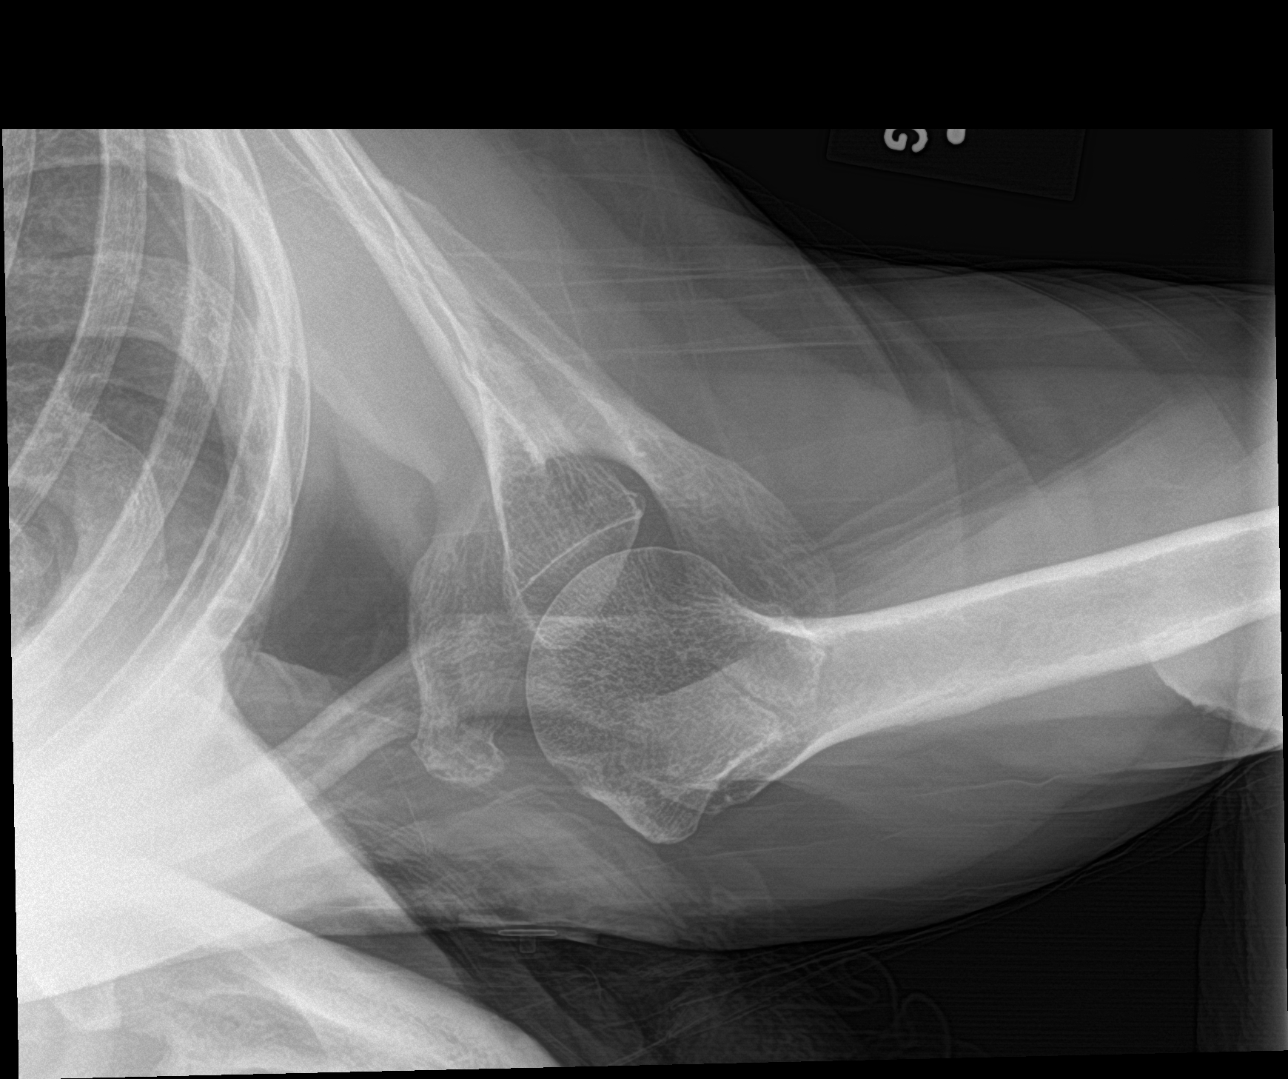

[3 of 3 positions shown; findings below may reference images not displayed]

FINDINGS: Negative for acute fracture or malalignment. No glenohumeral joint
narrowing. No soft tissue calcification. Os acromiale.
IMPRESSION: 1. No acute finding or degenerative joint narrowing.
2. Os acromiale.

## 2017-03-03 IMAGING — CT CT ANGIO CHEST
2 of 8 series · 19 of 36 positions shown · IV contrast (isovue)
Comparison: 04/01/2014

CLINICAL DATA: Short of breath

EXAM:
CT ANGIOGRAPHY CHEST WITH CONTRAST
TECHNIQUE: Multidetector CT imaging of the chest was performed using the
standard protocol during bolus administration of intravenous
contrast. Multiplanar CT image reconstructions and MIPs were
obtained to evaluate the vascular anatomy.
CONTRAST:  100 cc Isovue 370

[Series 8: pe thins · axial · 0.65mm/px · z∈[-239,+50]mm · 18 of 325 slices shown]
[im 18/325  lung]
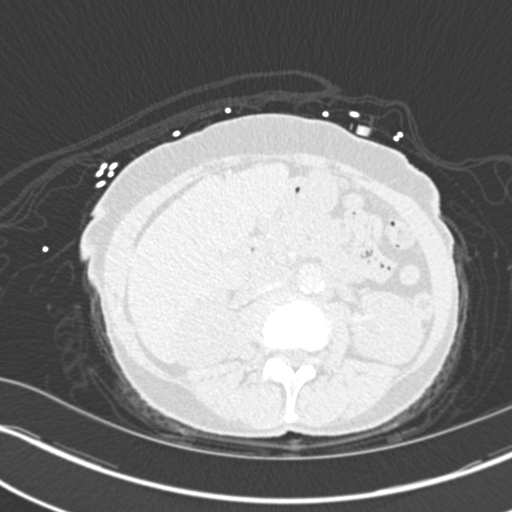
[im 35/325  mediastinal]
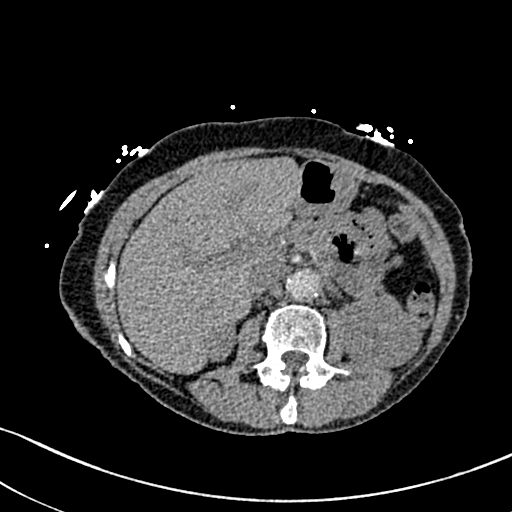
[im 52/325  lung]
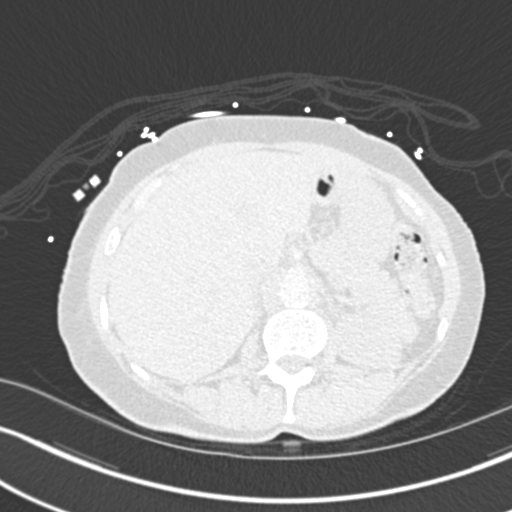
[im 69/325  mediastinal]
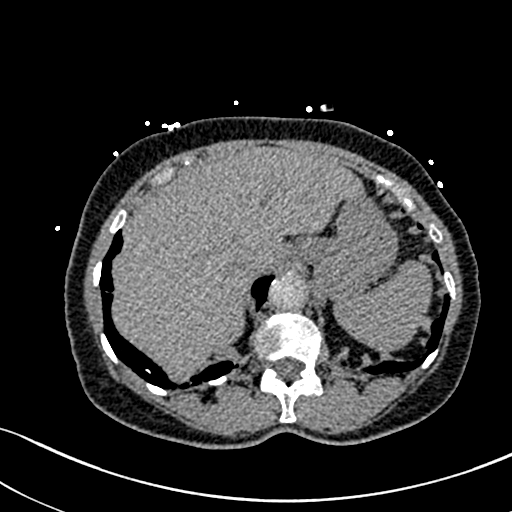
[im 86/325  lung]
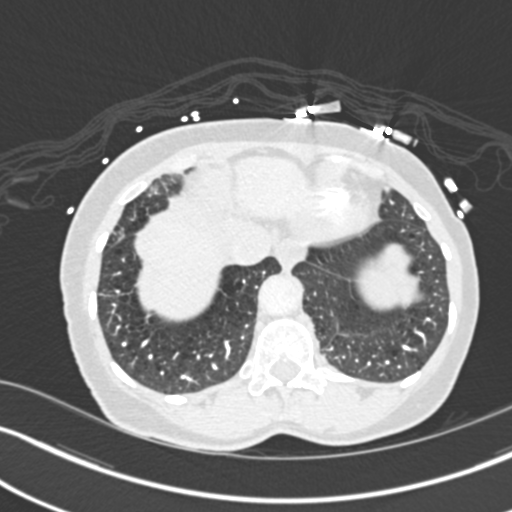
[im 103/325  mediastinal]
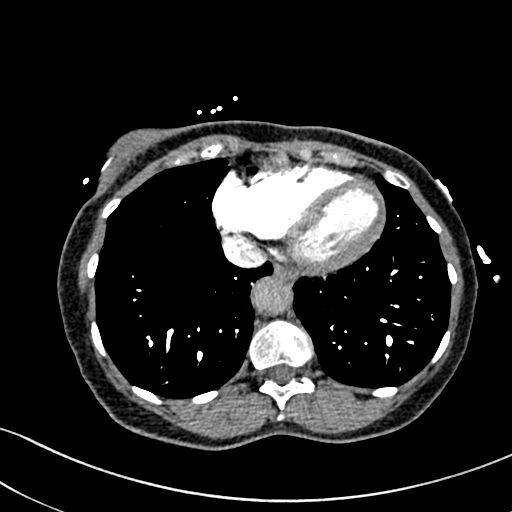
[im 120/325  lung]
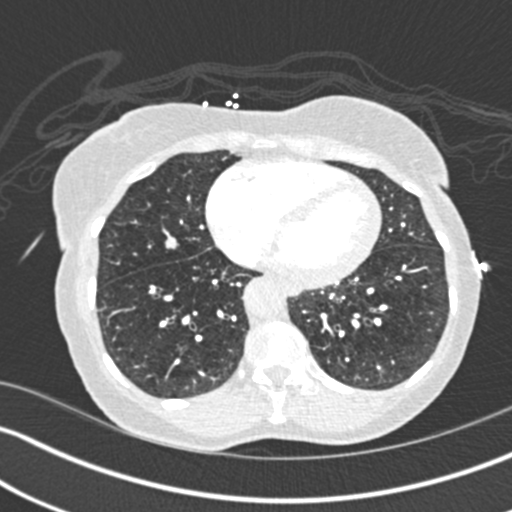
[im 137/325  mediastinal]
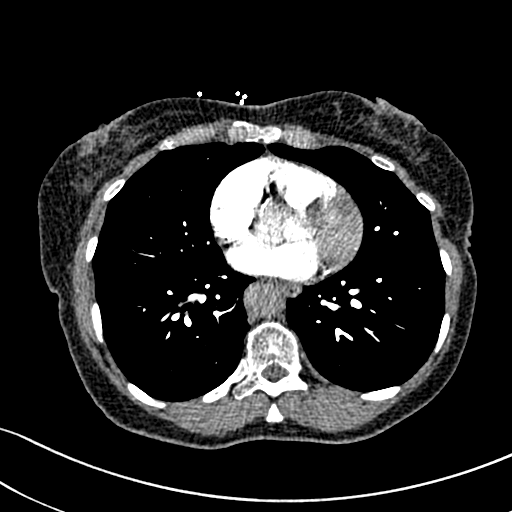
[im 154/325  lung]
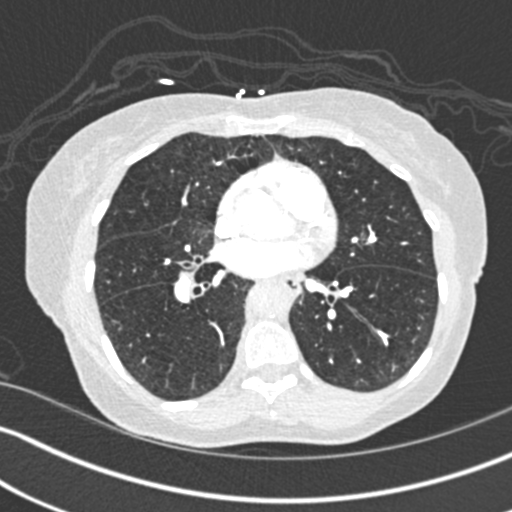
[im 171/325  mediastinal]
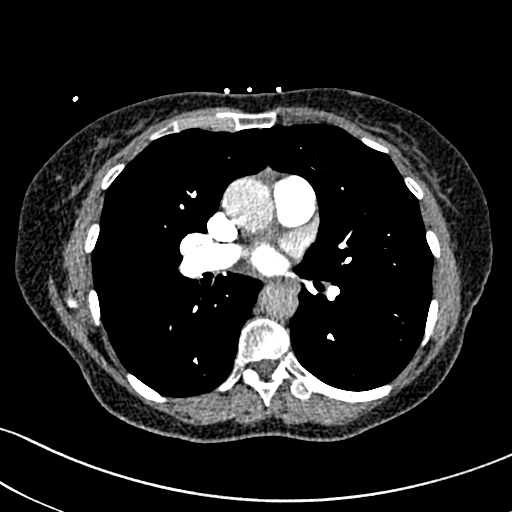
[im 188/325  lung]
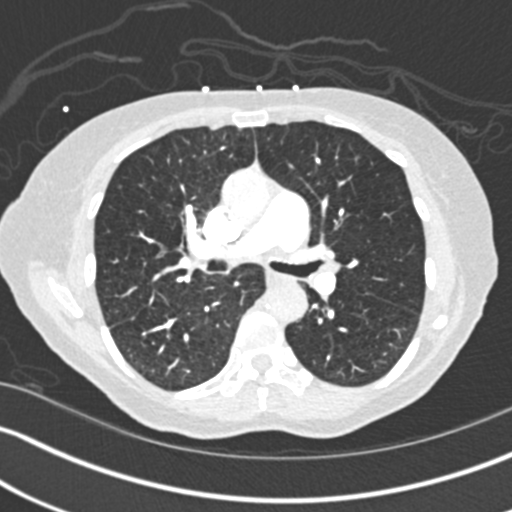
[im 205/325  mediastinal]
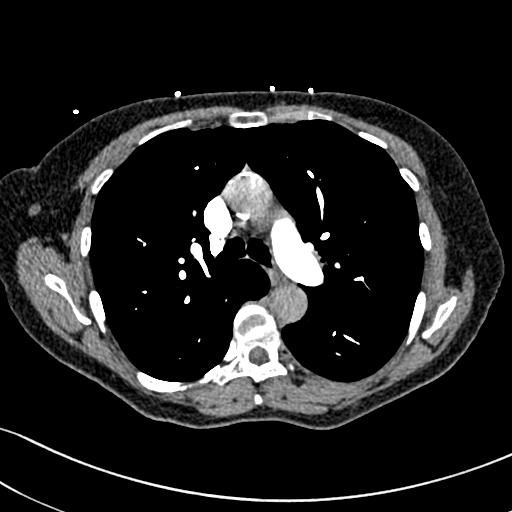
[im 222/325  lung]
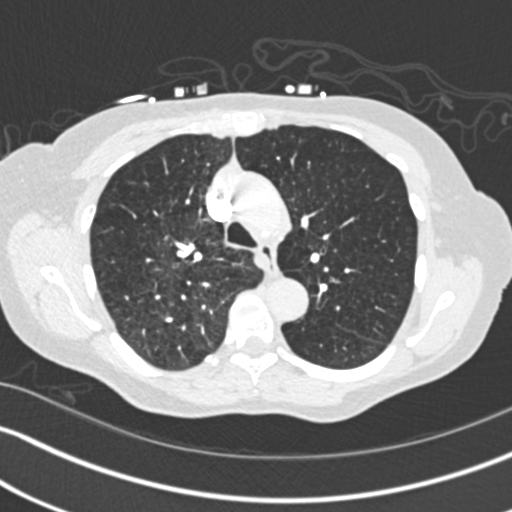
[im 239/325  mediastinal]
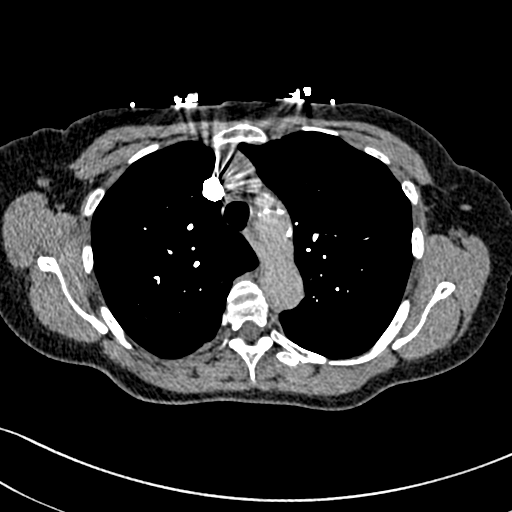
[im 256/325  lung]
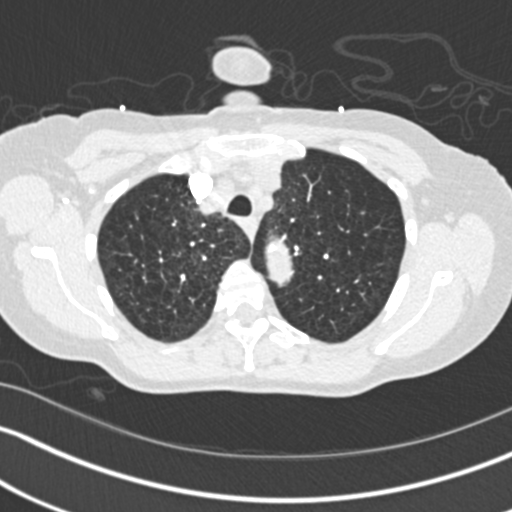
[im 273/325  mediastinal]
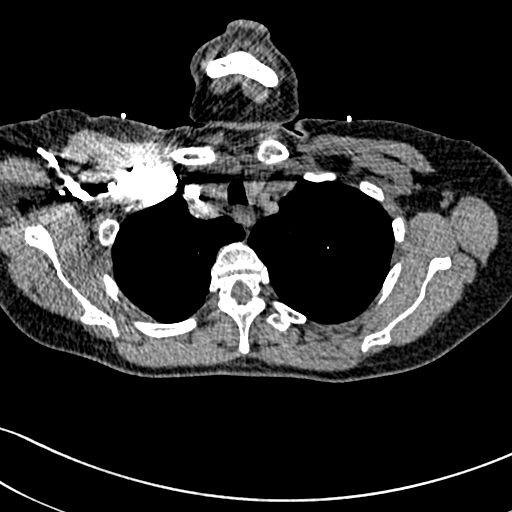
[im 290/325  lung]
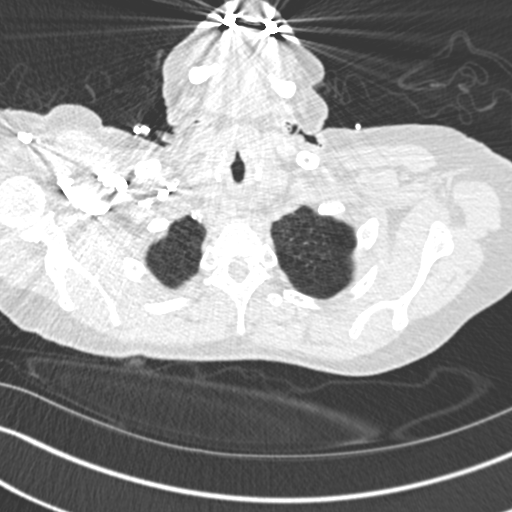
[im 307/325  mediastinal]
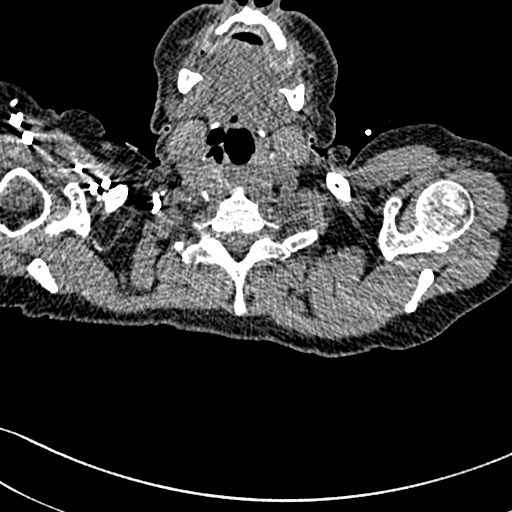

[Series 10: pe coronal mpr · coronal · 0.63mm/px · 1 of 106 slices shown]
[im 53/106  mediastinal]
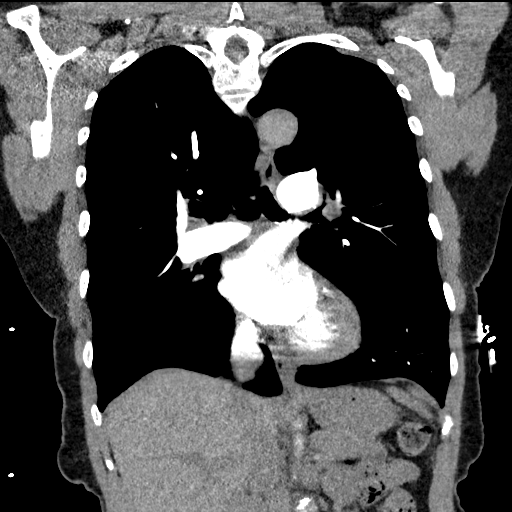

[19 of 36 positions shown; findings below may reference images not displayed]

FINDINGS: Cardiovascular: There are no filling defects in the pulmonary
arterial tree to suggest acute pulmonary thromboembolism. Coronary
artery calcifications in the LAD territory.

Mediastinum/Nodes: No abnormal mediastinal adenopathy.

Lungs/Pleura: Lobulated nodule in the right middle lobe on image 69
measures 9 mm. This is stable compare with 6644 supporting benign
etiology. Other opacities in the lungs seen previously have resolved
supporting inflammatory etiology. Emphysema is noted. No
pneumothorax or pleural effusion.

Upper Abdomen: No acute abnormality.

Musculoskeletal: No chest wall abnormality. No acute or significant
osseous findings.

Review of the MIP images confirms the above findings.
IMPRESSION: No evidence of acute pulmonary thromboembolism.

## 2017-03-24 DIAGNOSIS — J309 Allergic rhinitis, unspecified: Secondary | ICD-10-CM | POA: Diagnosis not present

## 2017-03-24 DIAGNOSIS — G4730 Sleep apnea, unspecified: Secondary | ICD-10-CM | POA: Diagnosis not present

## 2017-03-24 DIAGNOSIS — J449 Chronic obstructive pulmonary disease, unspecified: Secondary | ICD-10-CM | POA: Diagnosis not present

## 2017-03-24 DIAGNOSIS — G4733 Obstructive sleep apnea (adult) (pediatric): Secondary | ICD-10-CM | POA: Diagnosis not present

## 2017-04-10 DIAGNOSIS — E785 Hyperlipidemia, unspecified: Secondary | ICD-10-CM | POA: Diagnosis not present

## 2017-04-10 DIAGNOSIS — Z79891 Long term (current) use of opiate analgesic: Secondary | ICD-10-CM | POA: Diagnosis not present

## 2017-04-10 DIAGNOSIS — I10 Essential (primary) hypertension: Secondary | ICD-10-CM | POA: Diagnosis not present

## 2017-04-10 DIAGNOSIS — K219 Gastro-esophageal reflux disease without esophagitis: Secondary | ICD-10-CM | POA: Diagnosis not present

## 2017-04-10 DIAGNOSIS — J449 Chronic obstructive pulmonary disease, unspecified: Secondary | ICD-10-CM | POA: Diagnosis not present

## 2017-04-10 DIAGNOSIS — Z79899 Other long term (current) drug therapy: Secondary | ICD-10-CM | POA: Diagnosis not present

## 2017-04-10 DIAGNOSIS — M549 Dorsalgia, unspecified: Secondary | ICD-10-CM | POA: Diagnosis not present

## 2017-04-24 DIAGNOSIS — J309 Allergic rhinitis, unspecified: Secondary | ICD-10-CM | POA: Diagnosis not present

## 2017-04-24 DIAGNOSIS — J449 Chronic obstructive pulmonary disease, unspecified: Secondary | ICD-10-CM | POA: Diagnosis not present

## 2017-04-24 DIAGNOSIS — G4733 Obstructive sleep apnea (adult) (pediatric): Secondary | ICD-10-CM | POA: Diagnosis not present

## 2017-04-24 DIAGNOSIS — G4730 Sleep apnea, unspecified: Secondary | ICD-10-CM | POA: Diagnosis not present

## 2017-05-25 DIAGNOSIS — G4730 Sleep apnea, unspecified: Secondary | ICD-10-CM | POA: Diagnosis not present

## 2017-05-25 DIAGNOSIS — G4733 Obstructive sleep apnea (adult) (pediatric): Secondary | ICD-10-CM | POA: Diagnosis not present

## 2017-05-25 DIAGNOSIS — J449 Chronic obstructive pulmonary disease, unspecified: Secondary | ICD-10-CM | POA: Diagnosis not present

## 2017-05-25 DIAGNOSIS — J309 Allergic rhinitis, unspecified: Secondary | ICD-10-CM | POA: Diagnosis not present

## 2017-06-13 DIAGNOSIS — J441 Chronic obstructive pulmonary disease with (acute) exacerbation: Secondary | ICD-10-CM | POA: Diagnosis not present

## 2017-06-13 DIAGNOSIS — M549 Dorsalgia, unspecified: Secondary | ICD-10-CM | POA: Diagnosis not present

## 2017-06-13 DIAGNOSIS — K219 Gastro-esophageal reflux disease without esophagitis: Secondary | ICD-10-CM | POA: Diagnosis not present

## 2017-06-13 DIAGNOSIS — F419 Anxiety disorder, unspecified: Secondary | ICD-10-CM | POA: Diagnosis not present

## 2017-06-13 DIAGNOSIS — G894 Chronic pain syndrome: Secondary | ICD-10-CM | POA: Diagnosis not present

## 2017-06-13 DIAGNOSIS — E785 Hyperlipidemia, unspecified: Secondary | ICD-10-CM | POA: Diagnosis not present

## 2017-06-13 DIAGNOSIS — Z79891 Long term (current) use of opiate analgesic: Secondary | ICD-10-CM | POA: Diagnosis not present

## 2017-06-13 DIAGNOSIS — I10 Essential (primary) hypertension: Secondary | ICD-10-CM | POA: Diagnosis not present

## 2017-06-22 DIAGNOSIS — G4733 Obstructive sleep apnea (adult) (pediatric): Secondary | ICD-10-CM | POA: Diagnosis not present

## 2017-06-22 DIAGNOSIS — G4730 Sleep apnea, unspecified: Secondary | ICD-10-CM | POA: Diagnosis not present

## 2017-06-22 DIAGNOSIS — J449 Chronic obstructive pulmonary disease, unspecified: Secondary | ICD-10-CM | POA: Diagnosis not present

## 2017-06-22 DIAGNOSIS — J309 Allergic rhinitis, unspecified: Secondary | ICD-10-CM | POA: Diagnosis not present

## 2017-07-09 DIAGNOSIS — J301 Allergic rhinitis due to pollen: Secondary | ICD-10-CM | POA: Diagnosis not present

## 2017-07-09 DIAGNOSIS — R5383 Other fatigue: Secondary | ICD-10-CM | POA: Diagnosis not present

## 2017-07-09 DIAGNOSIS — J449 Chronic obstructive pulmonary disease, unspecified: Secondary | ICD-10-CM | POA: Diagnosis not present

## 2017-07-23 DIAGNOSIS — G4733 Obstructive sleep apnea (adult) (pediatric): Secondary | ICD-10-CM | POA: Diagnosis not present

## 2017-07-23 DIAGNOSIS — J309 Allergic rhinitis, unspecified: Secondary | ICD-10-CM | POA: Diagnosis not present

## 2017-07-23 DIAGNOSIS — G4730 Sleep apnea, unspecified: Secondary | ICD-10-CM | POA: Diagnosis not present

## 2017-07-23 DIAGNOSIS — J449 Chronic obstructive pulmonary disease, unspecified: Secondary | ICD-10-CM | POA: Diagnosis not present

## 2017-08-14 DIAGNOSIS — Z79891 Long term (current) use of opiate analgesic: Secondary | ICD-10-CM | POA: Diagnosis not present

## 2017-08-22 DIAGNOSIS — J449 Chronic obstructive pulmonary disease, unspecified: Secondary | ICD-10-CM | POA: Diagnosis not present

## 2017-08-22 DIAGNOSIS — J309 Allergic rhinitis, unspecified: Secondary | ICD-10-CM | POA: Diagnosis not present

## 2017-08-22 DIAGNOSIS — G4733 Obstructive sleep apnea (adult) (pediatric): Secondary | ICD-10-CM | POA: Diagnosis not present

## 2017-08-22 DIAGNOSIS — G4730 Sleep apnea, unspecified: Secondary | ICD-10-CM | POA: Diagnosis not present

## 2017-09-22 DIAGNOSIS — G4733 Obstructive sleep apnea (adult) (pediatric): Secondary | ICD-10-CM | POA: Diagnosis not present

## 2017-09-22 DIAGNOSIS — G4730 Sleep apnea, unspecified: Secondary | ICD-10-CM | POA: Diagnosis not present

## 2017-09-22 DIAGNOSIS — J449 Chronic obstructive pulmonary disease, unspecified: Secondary | ICD-10-CM | POA: Diagnosis not present

## 2017-09-22 DIAGNOSIS — J309 Allergic rhinitis, unspecified: Secondary | ICD-10-CM | POA: Diagnosis not present

## 2017-10-03 DIAGNOSIS — G894 Chronic pain syndrome: Secondary | ICD-10-CM | POA: Diagnosis not present

## 2017-10-03 DIAGNOSIS — M549 Dorsalgia, unspecified: Secondary | ICD-10-CM | POA: Diagnosis not present

## 2017-10-03 DIAGNOSIS — J449 Chronic obstructive pulmonary disease, unspecified: Secondary | ICD-10-CM | POA: Diagnosis not present

## 2017-10-03 DIAGNOSIS — Z79899 Other long term (current) drug therapy: Secondary | ICD-10-CM | POA: Diagnosis not present

## 2017-10-03 DIAGNOSIS — M542 Cervicalgia: Secondary | ICD-10-CM | POA: Diagnosis not present

## 2017-10-22 DIAGNOSIS — J449 Chronic obstructive pulmonary disease, unspecified: Secondary | ICD-10-CM | POA: Diagnosis not present

## 2017-10-22 DIAGNOSIS — J309 Allergic rhinitis, unspecified: Secondary | ICD-10-CM | POA: Diagnosis not present

## 2017-10-22 DIAGNOSIS — G4730 Sleep apnea, unspecified: Secondary | ICD-10-CM | POA: Diagnosis not present

## 2017-10-22 DIAGNOSIS — G4733 Obstructive sleep apnea (adult) (pediatric): Secondary | ICD-10-CM | POA: Diagnosis not present

## 2017-11-05 DIAGNOSIS — G4733 Obstructive sleep apnea (adult) (pediatric): Secondary | ICD-10-CM | POA: Diagnosis not present

## 2017-11-05 DIAGNOSIS — J301 Allergic rhinitis due to pollen: Secondary | ICD-10-CM | POA: Diagnosis not present

## 2017-11-05 DIAGNOSIS — R5383 Other fatigue: Secondary | ICD-10-CM | POA: Diagnosis not present

## 2017-11-05 DIAGNOSIS — J449 Chronic obstructive pulmonary disease, unspecified: Secondary | ICD-10-CM | POA: Diagnosis not present

## 2017-11-19 DIAGNOSIS — G4733 Obstructive sleep apnea (adult) (pediatric): Secondary | ICD-10-CM | POA: Diagnosis not present

## 2017-11-22 DIAGNOSIS — J449 Chronic obstructive pulmonary disease, unspecified: Secondary | ICD-10-CM | POA: Diagnosis not present

## 2017-11-22 DIAGNOSIS — G4730 Sleep apnea, unspecified: Secondary | ICD-10-CM | POA: Diagnosis not present

## 2017-11-22 DIAGNOSIS — J309 Allergic rhinitis, unspecified: Secondary | ICD-10-CM | POA: Diagnosis not present

## 2017-11-22 DIAGNOSIS — G4733 Obstructive sleep apnea (adult) (pediatric): Secondary | ICD-10-CM | POA: Diagnosis not present

## 2017-11-26 ENCOUNTER — Encounter (HOSPITAL_BASED_OUTPATIENT_CLINIC_OR_DEPARTMENT_OTHER): Payer: Self-pay

## 2017-11-26 ENCOUNTER — Other Ambulatory Visit: Payer: Self-pay

## 2017-11-26 ENCOUNTER — Emergency Department (HOSPITAL_BASED_OUTPATIENT_CLINIC_OR_DEPARTMENT_OTHER)
Admission: EM | Admit: 2017-11-26 | Discharge: 2017-11-26 | Disposition: A | Discharge status: Home / Self Care | Payer: Medicare HMO | Attending: Emergency Medicine | Admitting: Emergency Medicine

## 2017-11-26 DIAGNOSIS — J449 Chronic obstructive pulmonary disease, unspecified: Secondary | ICD-10-CM | POA: Insufficient documentation

## 2017-11-26 DIAGNOSIS — F17200 Nicotine dependence, unspecified, uncomplicated: Secondary | ICD-10-CM | POA: Insufficient documentation

## 2017-11-26 DIAGNOSIS — H5789 Other specified disorders of eye and adnexa: Secondary | ICD-10-CM

## 2017-11-26 DIAGNOSIS — I10 Essential (primary) hypertension: Secondary | ICD-10-CM | POA: Diagnosis not present

## 2017-11-26 DIAGNOSIS — H10022 Other mucopurulent conjunctivitis, left eye: Secondary | ICD-10-CM | POA: Diagnosis present

## 2017-11-26 MED ORDER — FLUORESCEIN SODIUM 1 MG OP STRP
1.0000 | ORAL_STRIP | Freq: Once | OPHTHALMIC | Status: AC
  Administered 2017-11-26: 1 via OPHTHALMIC
  Filled 2017-11-26: qty 1

## 2017-11-26 MED ORDER — TETRACAINE HCL 0.5 % OP SOLN
1.0000 [drp] | Freq: Once | OPHTHALMIC | Status: AC
  Administered 2017-11-26: 1 [drp] via OPHTHALMIC
  Filled 2017-11-26: qty 4

## 2017-11-26 NOTE — ED Provider Notes (Signed)
[place] EMERGENCY DEPARTMENT Provider Note   CSN: [id number] Arrival date & time: 11/26/17  1717     History   Chief Complaint Chief Complaint  Patient presents with  . Eye Drainage    HPI Donna Cross is a 77 y.o. female.  HPI  Patient is a 77 year old female with a history of emphysema, hypertension, and asthma presenting for profuse left eye irritation and watery drainage.  Patient reports that it began 2 weeks ago, and she felt like she had "sandpaper in her eye".  No trauma noted or foreign bodies to the eye.  Patient reports that she went her primary care provider who prescribed her a "anti-itch drop" in her eye, but she cannot remember the name.  Patient was at work for 2 days, however she had return of the foreign body sensation and watery drainage.  Patient reports that she then obtained over-the-counter eyedrops.  Patient reports that over the past 5 days, she has had increasing drainage that will keep her eye matted shut at night.  Patient reports that she has a slight pain in the supraorbital region, but no retro-orbital pain, pain with extraocular movements, fever, chills.  Patient denies use of contact lenses or glasses.  Patient has no regular ophthalmology follow-up.  Patient denies any history of autoimmune diseases.  Past Medical History:  Diagnosis Date  . Asthma   . COPD (chronic obstructive pulmonary disease) ([place])   . Emphysema   . Hypertension     Patient Active Problem List   Diagnosis Date Noted  . HYPERLIPIDEMIA 07/10/2007  . HYPERTENSION 07/10/2007  . EMPHYSEMA 07/10/2007  . 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 OSTEOPENIA 07/10/2007    History reviewed. No pertinent surgical history.   OB History   None      Home Medications    Prior to Admission medications   Medication Sig Start Date End Date Taking? Authorizing Provider  OXYGEN Inhale into the lungs.   Yes [provider]  albuterol (PROVENTIL HFA;VENTOLIN HFA) 108  (90 BASE) MCG/ACT inhaler Inhale 2 puffs into the lungs every 4 (four) hours as needed for wheezing or shortness of breath. 05/13/11 11/04/15  Charlena Cross, MD  albuterol (PROVENTIL) (2.5 MG/3ML) 0.083% nebulizer solution Take 2.5 mg by nebulization every 6 (six) hours as needed.    [provider]  amLODipine (NORVASC) 10 MG tablet Take 10 mg by mouth daily.    [provider]  cyclobenzaprine (FLEXERIL) 5 MG tablet Take 5 mg by mouth 3 (three) times daily as needed.    [provider]  diclofenac sodium (VOLTAREN) 1 % GEL Apply 2 g topically 4 (four) times daily. 06/02/16   Ward, Ozella Almond, PA-C  enalapril (VASOTEC) 10 MG tablet Take 10 mg by mouth daily.    [provider]  hydroxypropyl methylcellulose / hypromellose (ISOPTO TEARS / GONIOVISC) 2.5 % ophthalmic solution Place 1 drop into both eyes 3 (three) times daily as needed for dry eyes. 11/04/15   Nona Dell, PA-C  mometasone-formoterol (DULERA) 100-5 MCG/ACT AERO Inhale 2 puffs into the lungs 2 (two) times daily.    [provider]  naphazoline-pheniramine (NAPHCON-A) 0.025-0.3 % ophthalmic solution Place 1 drop into both eyes every 4 (four) hours as needed for irritation. 11/04/15   Nona Dell, PA-C    Family History No family history on file.  Social History Social History   Tobacco Use  . Smoking status: Former Research scientist (life sciences)  . Smokeless tobacco: Never  Used  Substance Use Topics  . Alcohol use: No  . Drug use: No     Allergies   Penicillins   Review of Systems Review of Systems  Constitutional: Negative for chills and fever.  HENT: Negative for congestion, rhinorrhea and sore throat.   Eyes: Positive for pain, discharge, redness, itching and visual disturbance.  Respiratory: Negative for chest tightness.   Cardiovascular: Negative for chest pain.  Gastrointestinal: Negative for abdominal pain, nausea and vomiting.  Musculoskeletal: Negative for  arthralgias and joint swelling.  All other systems reviewed and are negative.    Physical Exam Updated Vital Signs BP (!) 157/64 (BP Location: Right Arm)   Pulse 69   Temp 98.5 F (36.9 C) (Oral)   Resp 20   Ht 4\' 9"  (1.448 m)   Wt 48.5 kg   SpO2 93%   BMI 23.15 kg/m   Physical Exam  Constitutional: She appears well-developed and well-nourished. No distress.  Sitting comfortably in bed.  HENT:  Head: Normocephalic and atraumatic.  Eyes: Pupils are equal, round, and reactive to light. EOM are normal. Right eye exhibits no discharge. Left eye exhibits discharge.  Left Eye Exam: No periorbital edema or erythema. Diffuse scleral injection and scleral chemosis. Conjunctival erythema w/ chemosis. No foreign bodies identified on lid eversion. PERRL. EOMI and no pain with extraocular movements. Slit lamp examination demonstrates no corneal ulceration, hyphema, or cell or flare. Wood's lamp examination demonstrates no punctate uptake.  Tono-Pen pressures are 19 in the left eye and 18 in the right eye.  Neck: Normal range of motion.  Cardiovascular: Normal rate, regular rhythm and normal heart sounds.  Intact, 2+ radial pulse.   Pulmonary/Chest: Effort normal and breath sounds normal. She has no wheezes. She has no rales.  Normal respiratory effort. Patient converses comfortably. No audible wheeze or stridor.  Abdominal: Soft. She exhibits no distension.  Musculoskeletal: Normal range of motion.  Neurological: She is alert.  Cranial nerves intact to gross observation. Patient moves extremities without difficulty.  Skin: Skin is warm and dry. She is not diaphoretic.  Psychiatric: She has a normal mood and affect. Her behavior is normal. Judgment and thought content normal.  Nursing note and vitals reviewed.     Visual Acuity  Right Eye Distance: 20/70(Uncorrected) Left Eye Distance: 20/200(Uncorrected) Bilateral Distance: 20/70(Uncorrected)  Right Eye Near:   Left Eye Near:      Bilateral Near:        ED Treatments / Results  Labs (all labs ordered are listed, but only abnormal results are displayed) Labs Reviewed - No data to display  EKG None  Radiology No results found.  Procedures Procedures (including critical care time)  Medications Ordered in ED Medications  tetracaine (PONTOCAINE) 0.5 % ophthalmic solution 1 drop (has no administration in time range)  fluorescein ophthalmic strip 1 strip (has no administration in time range)     Initial Impression / Assessment and Plan / ED Course  I have reviewed the triage vital signs and the nursing notes.  Pertinent labs & imaging results that were available during my care of the patient were reviewed by me and considered in my medical decision making (see chart for details).  Clinical Course as of Nov 26 2020  Wed Nov 26, 2017  2010 Spoke with Dr. Prudencio Burly' of ophthalmology, who recommends that patient be seen in his office tomorrow or the next day.  Patient does have features possibly consistent with inflammatory digits of the eye such as scleritis.  Does not recommend any additional medications at this time besides patient's home artificial tears.  I appreciate Dr. Prudencio Burly' involvement in the care of this patient.   [AM]  7931 77 year old female complaining of 2 weeks of left eye itchiness and some crusting and drainage.  She is tried some topical applications without any improvement.  Here she has some decreased acuity but is hard to know what her baseline is.  We have checked her eye pressure which is normal.  Slit-lamp is also unremarkable.  Discussion with ophthalmology and they will see her as an outpatient.   [MB]    Clinical Course User Index [AM] Albesa Seen, PA-C [MB] Hayden Rasmussen, MD    Patient is nontoxic-appearing, afebrile, and in no acute distress.  Differential diagnosis includes conjunctivitis, bacterial versus viral versus allergic, scleritis, episcleritis, iritis, acute  angle-closure glaucoma.  Per discussion with ophthalmology, patient does have features possibly consistent with inflammatory conditions of the eye given time course, and significant ecchymosis of sclera.  Patient to follow-up with Dr. Prudencio Burly in the next 48 hours.  She is given instructions on this, and I encouraged patient to get a ride, as I do not recommend driving while she is having decreased visual acuity.  Patient can return precautions for any significantly worsening pain, fevers, pain with extraocular motions, or swelling around the eye.  Patient is in understanding and agrees with the plan of care.  This is a shared visit with Dr. Aletta Edouard. Patient was independently evaluated by this attending physician. Attending physician consulted in evaluation and discharge management.   Final Clinical Impressions(s) / ED Diagnoses   Final diagnoses:  Eye discharge  Elevated blood pressure reading with diagnosis of hypertension    ED Discharge Orders    None       Tamala Julian 11/26/17 2022    Hayden Rasmussen, MD 11/28/17 1354

## 2017-11-26 NOTE — ED Notes (Signed)
ED Provider at bedside. 

## 2017-11-26 NOTE — Discharge Instructions (Addendum)
Your exam is suggestive of possible inflammatory condition of the eye.  This would be a condition where you would have inflammation in 1 of the layers of the eye caused by your body attacking the eye tissue itself.  I spoke with Dr. Prudencio Burly, an ophthalmologist, who agrees that until we have you evaluated by an eye doctor, we will not start any new medications.  Please continue your over-the-counter eyedrops for dry eye.  Please return the emergency department if you develop any worsening pain, fevers, or swelling around the eye.

## 2017-11-26 NOTE — ED Triage Notes (Signed)
C/o left eye d/c and swelling x 2 weeks-to triage in w/c-NAD

## 2017-11-27 DIAGNOSIS — H1032 Unspecified acute conjunctivitis, left eye: Secondary | ICD-10-CM | POA: Diagnosis not present

## 2017-12-23 DIAGNOSIS — J309 Allergic rhinitis, unspecified: Secondary | ICD-10-CM | POA: Diagnosis not present

## 2017-12-23 DIAGNOSIS — G4733 Obstructive sleep apnea (adult) (pediatric): Secondary | ICD-10-CM | POA: Diagnosis not present

## 2017-12-23 DIAGNOSIS — J449 Chronic obstructive pulmonary disease, unspecified: Secondary | ICD-10-CM | POA: Diagnosis not present

## 2017-12-23 DIAGNOSIS — G4730 Sleep apnea, unspecified: Secondary | ICD-10-CM | POA: Diagnosis not present

## 2017-12-24 DIAGNOSIS — J449 Chronic obstructive pulmonary disease, unspecified: Secondary | ICD-10-CM | POA: Diagnosis not present

## 2017-12-24 DIAGNOSIS — G4733 Obstructive sleep apnea (adult) (pediatric): Secondary | ICD-10-CM | POA: Diagnosis not present

## 2017-12-24 DIAGNOSIS — R5383 Other fatigue: Secondary | ICD-10-CM | POA: Diagnosis not present

## 2017-12-24 DIAGNOSIS — J301 Allergic rhinitis due to pollen: Secondary | ICD-10-CM | POA: Diagnosis not present

## 2018-01-12 DIAGNOSIS — G4733 Obstructive sleep apnea (adult) (pediatric): Secondary | ICD-10-CM | POA: Diagnosis not present

## 2018-02-01 ENCOUNTER — Inpatient Hospital Stay (HOSPITAL_BASED_OUTPATIENT_CLINIC_OR_DEPARTMENT_OTHER)
Admission: EM | Admit: 2018-02-01 | Discharge: 2018-02-04 | DRG: 190 | Disposition: A | Discharge status: Home Health | Payer: Medicare Other | Attending: Internal Medicine | Admitting: Internal Medicine

## 2018-02-01 ENCOUNTER — Encounter (HOSPITAL_BASED_OUTPATIENT_CLINIC_OR_DEPARTMENT_OTHER): Payer: Self-pay | Admitting: *Deleted

## 2018-02-01 ENCOUNTER — Emergency Department (HOSPITAL_BASED_OUTPATIENT_CLINIC_OR_DEPARTMENT_OTHER): Payer: Medicare Other

## 2018-02-01 ENCOUNTER — Other Ambulatory Visit: Payer: Self-pay

## 2018-02-01 DIAGNOSIS — Z7951 Long term (current) use of inhaled steroids: Secondary | ICD-10-CM | POA: Diagnosis not present

## 2018-02-01 DIAGNOSIS — I10 Essential (primary) hypertension: Secondary | ICD-10-CM | POA: Diagnosis present

## 2018-02-01 DIAGNOSIS — J441 Chronic obstructive pulmonary disease with (acute) exacerbation: Secondary | ICD-10-CM | POA: Diagnosis present

## 2018-02-01 DIAGNOSIS — J189 Pneumonia, unspecified organism: Secondary | ICD-10-CM | POA: Diagnosis present

## 2018-02-01 DIAGNOSIS — R918 Other nonspecific abnormal finding of lung field: Secondary | ICD-10-CM | POA: Diagnosis present

## 2018-02-01 DIAGNOSIS — Z79899 Other long term (current) drug therapy: Secondary | ICD-10-CM

## 2018-02-01 DIAGNOSIS — R042 Hemoptysis: Secondary | ICD-10-CM | POA: Diagnosis present

## 2018-02-01 DIAGNOSIS — Z86711 Personal history of pulmonary embolism: Secondary | ICD-10-CM

## 2018-02-01 DIAGNOSIS — Z880 Allergy status to penicillin: Secondary | ICD-10-CM | POA: Diagnosis not present

## 2018-02-01 DIAGNOSIS — Z87891 Personal history of nicotine dependence: Secondary | ICD-10-CM

## 2018-02-01 DIAGNOSIS — J440 Chronic obstructive pulmonary disease with acute lower respiratory infection: Secondary | ICD-10-CM | POA: Diagnosis present

## 2018-02-01 DIAGNOSIS — J9611 Chronic respiratory failure with hypoxia: Secondary | ICD-10-CM | POA: Diagnosis present

## 2018-02-01 DIAGNOSIS — Z9981 Dependence on supplemental oxygen: Secondary | ICD-10-CM | POA: Diagnosis not present

## 2018-02-01 DIAGNOSIS — E785 Hyperlipidemia, unspecified: Secondary | ICD-10-CM | POA: Diagnosis present

## 2018-02-01 DIAGNOSIS — C3490 Malignant neoplasm of unspecified part of unspecified bronchus or lung: Secondary | ICD-10-CM | POA: Diagnosis present

## 2018-02-01 LAB — CBC
HCT: 34.9 % — ABNORMAL LOW (ref 36.0–46.0)
Hemoglobin: 10.9 g/dL — ABNORMAL LOW (ref 12.0–15.0)
MCH: 26.5 pg (ref 26.0–34.0)
MCHC: 31.2 g/dL (ref 30.0–36.0)
MCV: 84.9 fL (ref 80.0–100.0)
Platelets: 301 10*3/uL (ref 150–400)
RBC: 4.11 MIL/uL (ref 3.87–5.11)
RDW: 13.4 % (ref 11.5–15.5)
WBC: 5.2 10*3/uL (ref 4.0–10.5)
nRBC: 0 % (ref 0.0–0.2)

## 2018-02-01 LAB — COMPREHENSIVE METABOLIC PANEL
ALT: 20 U/L (ref 0–44)
AST: 26 U/L (ref 15–41)
Albumin: 3.8 g/dL (ref 3.5–5.0)
Alkaline Phosphatase: 94 U/L (ref 38–126)
Anion gap: 8 (ref 5–15)
BUN: 9 mg/dL (ref 8–23)
CO2: 30 mmol/L (ref 22–32)
Calcium: 9.4 mg/dL (ref 8.9–10.3)
Chloride: 101 mmol/L (ref 98–111)
Creatinine, Ser: 0.68 mg/dL (ref 0.44–1.00)
GFR calc Af Amer: >60 mL/min (ref >60)
GFR calc non Af Amer: >60 mL/min (ref >60)
Glucose, Bld: 103 mg/dL — ABNORMAL HIGH (ref 70–99)
Potassium: 4 mmol/L (ref 3.5–5.1)
Sodium: 139 mmol/L (ref 135–145)
Total Bilirubin: 0.4 mg/dL (ref 0.3–1.2)
Total Protein: 8.3 g/dL — ABNORMAL HIGH (ref 6.5–8.1)

## 2018-02-01 LAB — PROTIME-INR
INR: 1.04
Prothrombin Time: 13.5 seconds (ref 11.4–15.2)

## 2018-02-01 LAB — BRAIN NATRIURETIC PEPTIDE: B Natriuretic Peptide: 23.8 pg/mL (ref 0.0–100.0)

## 2018-02-01 LAB — TROPONIN I: Troponin I: <0.03 ng/mL (ref <0.03)

## 2018-02-01 MED ORDER — SODIUM CHLORIDE 0.9 % IV SOLN
INTRAVENOUS | Status: DC
  Administered 2018-02-01 – 2018-02-04 (×5): via INTRAVENOUS

## 2018-02-01 MED ORDER — ENOXAPARIN SODIUM 30 MG/0.3ML ~~LOC~~ SOLN
30.0000 mg | SUBCUTANEOUS | Status: DC
  Filled 2018-02-01 (×2): qty 0.3

## 2018-02-01 MED ORDER — ACETAMINOPHEN 325 MG PO TABS: 650.0000 mg | ORAL_TABLET | Freq: Four times a day (QID) | ORAL | Status: DC | PRN

## 2018-02-01 MED ORDER — ONDANSETRON HCL 4 MG PO TABS: 4.0000 mg | ORAL_TABLET | Freq: Four times a day (QID) | ORAL | Status: DC | PRN

## 2018-02-01 MED ORDER — OXYCODONE HCL 5 MG PO TABS: 5.0000 mg | ORAL_TABLET | ORAL | Status: DC | PRN

## 2018-02-01 MED ORDER — IPRATROPIUM BROMIDE 0.02 % IN SOLN: 0.5000 mg | Freq: Four times a day (QID) | RESPIRATORY_TRACT | Status: DC | PRN

## 2018-02-01 MED ORDER — PREDNISONE 50 MG PO TABS
60.0000 mg | ORAL_TABLET | Freq: Once | ORAL | Status: AC
  Administered 2018-02-01: 60 mg via ORAL
  Filled 2018-02-01: qty 1

## 2018-02-01 MED ORDER — FLUTICASONE-UMECLIDIN-VILANT 100-62.5-25 MCG/INH IN AEPB: 1.0000 | INHALATION_SPRAY | Freq: Every day | RESPIRATORY_TRACT | Status: DC

## 2018-02-01 MED ORDER — ENALAPRIL MALEATE 10 MG PO TABS
10.0000 mg | ORAL_TABLET | Freq: Once | ORAL | Status: AC
  Administered 2018-02-01: 10 mg via ORAL
  Filled 2018-02-01: qty 1

## 2018-02-01 MED ORDER — ALBUTEROL SULFATE (2.5 MG/3ML) 0.083% IN NEBU: 2.5000 mg | INHALATION_SOLUTION | Freq: Four times a day (QID) | RESPIRATORY_TRACT | Status: DC | PRN

## 2018-02-01 MED ORDER — LEVOFLOXACIN IN D5W 500 MG/100ML IV SOLN
500.0000 mg | Freq: Once | INTRAVENOUS | Status: AC
  Administered 2018-02-01: 500 mg via INTRAVENOUS
  Filled 2018-02-01: qty 100

## 2018-02-01 MED ORDER — HYPROMELLOSE (GONIOSCOPIC) 2.5 % OP SOLN: 1.0000 [drp] | Freq: Three times a day (TID) | OPHTHALMIC | Status: DC | PRN

## 2018-02-01 MED ORDER — OXYCODONE HCL 5 MG PO TABS
10.0000 mg | ORAL_TABLET | ORAL | Status: DC | PRN
  Administered 2018-02-03: 10 mg via ORAL
  Filled 2018-02-01: qty 2

## 2018-02-01 MED ORDER — BISACODYL 5 MG PO TBEC: 5.0000 mg | DELAYED_RELEASE_TABLET | Freq: Every day | ORAL | Status: DC | PRN

## 2018-02-01 MED ORDER — AMLODIPINE BESYLATE 10 MG PO TABS
10.0000 mg | ORAL_TABLET | Freq: Every day | ORAL | Status: DC
  Administered 2018-02-02 – 2018-02-04 (×3): 10 mg via ORAL
  Filled 2018-02-01 (×3): qty 1

## 2018-02-01 MED ORDER — POLYMYXIN B-TRIMETHOPRIM 10000-0.1 UNIT/ML-% OP SOLN
1.0000 [drp] | Freq: Four times a day (QID) | OPHTHALMIC | Status: DC | PRN
  Administered 2018-02-02: 1 [drp] via OPHTHALMIC
  Filled 2018-02-01: qty 10

## 2018-02-01 MED ORDER — DICLOFENAC SODIUM 1 % TD GEL
2.0000 g | Freq: Four times a day (QID) | TRANSDERMAL | Status: DC
  Administered 2018-02-02 – 2018-02-04 (×6): 2 g via TOPICAL
  Filled 2018-02-01: qty 100

## 2018-02-01 MED ORDER — POLYVINYL ALCOHOL 1.4 % OP SOLN
1.0000 [drp] | Freq: Three times a day (TID) | OPHTHALMIC | Status: DC | PRN
  Filled 2018-02-01: qty 15

## 2018-02-01 MED ORDER — LEVOFLOXACIN IN D5W 500 MG/100ML IV SOLN: 500.0000 mg | INTRAVENOUS | Status: DC

## 2018-02-01 MED ORDER — MAGNESIUM CITRATE PO SOLN: 1.0000 | Freq: Once | ORAL | Status: DC | PRN

## 2018-02-01 MED ORDER — MOMETASONE FURO-FORMOTEROL FUM 100-5 MCG/ACT IN AERO
2.0000 | INHALATION_SPRAY | Freq: Two times a day (BID) | RESPIRATORY_TRACT | Status: DC
  Administered 2018-02-01 – 2018-02-02 (×3): 2 via RESPIRATORY_TRACT
  Filled 2018-02-01: qty 8.8

## 2018-02-01 MED ORDER — IOPAMIDOL (ISOVUE-300) INJECTION 61%
100.0000 mL | Freq: Once | INTRAVENOUS | Status: AC | PRN
  Administered 2018-02-01: 80 mL via INTRAVENOUS

## 2018-02-01 MED ORDER — ONDANSETRON HCL 4 MG/2ML IJ SOLN: 4.0000 mg | Freq: Four times a day (QID) | INTRAMUSCULAR | Status: DC | PRN

## 2018-02-01 MED ORDER — CYCLOBENZAPRINE HCL 5 MG PO TABS: 5.0000 mg | ORAL_TABLET | Freq: Three times a day (TID) | ORAL | Status: DC | PRN

## 2018-02-01 MED ORDER — LEVOFLOXACIN IN D5W 750 MG/150ML IV SOLN
750.0000 mg | INTRAVENOUS | Status: DC
  Filled 2018-02-01: qty 150

## 2018-02-01 MED ORDER — IPRATROPIUM-ALBUTEROL 0.5-2.5 (3) MG/3ML IN SOLN
3.0000 mL | Freq: Once | RESPIRATORY_TRACT | Status: AC
  Administered 2018-02-01: 3 mL via RESPIRATORY_TRACT
  Filled 2018-02-01: qty 3

## 2018-02-01 MED ORDER — NAPHAZOLINE-PHENIRAMINE 0.025-0.3 % OP SOLN
1.0000 [drp] | Freq: Four times a day (QID) | OPHTHALMIC | Status: DC | PRN
  Administered 2018-02-04: 1 [drp] via OPHTHALMIC
  Filled 2018-02-01: qty 15

## 2018-02-01 MED ORDER — ALBUTEROL SULFATE (2.5 MG/3ML) 0.083% IN NEBU
2.5000 mg | INHALATION_SOLUTION | Freq: Once | RESPIRATORY_TRACT | Status: AC
  Administered 2018-02-01: 2.5 mg via RESPIRATORY_TRACT
  Filled 2018-02-01: qty 3

## 2018-02-01 MED ORDER — ENALAPRIL MALEATE 10 MG PO TABS
10.0000 mg | ORAL_TABLET | Freq: Every day | ORAL | Status: DC
  Administered 2018-02-02 – 2018-02-04 (×3): 10 mg via ORAL
  Filled 2018-02-01 (×3): qty 1

## 2018-02-01 MED ORDER — AMLODIPINE BESYLATE 5 MG PO TABS
10.0000 mg | ORAL_TABLET | Freq: Once | ORAL | Status: AC
  Administered 2018-02-01: 10 mg via ORAL
  Filled 2018-02-01: qty 2

## 2018-02-01 MED ORDER — SENNOSIDES-DOCUSATE SODIUM 8.6-50 MG PO TABS: 1.0000 | ORAL_TABLET | Freq: Every evening | ORAL | Status: DC | PRN

## 2018-02-01 MED ORDER — ACETAMINOPHEN 650 MG RE SUPP: 650.0000 mg | Freq: Four times a day (QID) | RECTAL | Status: DC | PRN

## 2018-02-01 NOTE — Progress Notes (Signed)
Was called by ED physician for patient with history of COPD presenting to the ED with worsening shortness of breath, hemoptysis.  CT chest which was done concerning for pulmonary mass and obstructive pneumonitis.  Patient given a dose of empiric IV antibiotics.  Hospitalist called to admit the patient for further evaluation and management of hemoptysis/lung mass and obstructive pneumonitis.  Patient accepted to telemetry.  No charge.

## 2018-02-01 NOTE — H&P (Signed)
History and Physical   [facility] [place] @  Admission History and Physical [employer], D.O.    Patient Name: Donna Cross MR#: [id number] Date of Birth: 1940/06/07 Date of Admission: 02/01/2018  Referring MD/NP/PA: [facility] Primary Care Physician: System, Pcp Not In  Chief Complaint:  Chief Complaint  Patient presents with  . Hemoptysis    HPI: Donna Cross is a 77 y.o. 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  Patient was in a usual state of health until about 2 weeks ago patient reports worsening of her chronic productive cough increased frequency and increased sputum production.  She did see her pulmonologist and was ordered a CPAP machine.  She reports that yesterday she noticed a pink tinge to her sputum and today she noticed mucus here to be tissue with frank blood.   Patient denies fevers/chills, weakness, dizziness, chest pain, shortness of breath, N/V/C/D, abdominal pain, dysuria/frequency, changes in mental status.    Otherwise there has been no change in status. Patient has been taking medication as prescribed and there has been no recent change in medication or diet.  No recent antibiotics.  There has been no recent illness, hospitalizations, travel or sick contacts.    EMS/ED Course: In the emergency department patient received Levaquin, prednisone, duo nebs. Medical admission has been requested for further management of hemoptysis likely secondary to lung mass and postobstructive pneumonitis/pneumonia.  Review of Systems:  CONSTITUTIONAL: No fever/chills, fatigue, weakness, weight gain/loss, headache. EYES: No blurry or double vision. ENT: No tinnitus, postnasal drip, redness or soreness of the oropharynx. RESPIRATORY: Positive productive cough, dyspnea, wheeze,   hemoptysis.  CARDIOVASCULAR: No chest pain, palpitations, syncope, orthopnea. No lower extremity edema.  GASTROINTESTINAL: No nausea, vomiting, abdominal pain, diarrhea, constipation.  No hematemesis, melena or hematochezia. GENITOURINARY: No dysuria, frequency, hematuria. ENDOCRINE: No polyuria or nocturia. No heat or cold intolerance. HEMATOLOGY: No anemia, bruising, bleeding. INTEGUMENTARY: No rashes, ulcers, lesions. MUSCULOSKELETAL: No arthritis, gout, dyspnea. NEUROLOGIC: No numbness, tingling, ataxia, seizure-type activity, weakness. PSYCHIATRIC: No anxiety, depression, insomnia.   Past Medical History:  Diagnosis Date  . Asthma   . COPD (chronic obstructive pulmonary disease) ([place])   . Emphysema   . Hypertension     History reviewed. No pertinent surgical history.   reports that she has quit smoking. She has never used smokeless tobacco. She reports that she does not drink alcohol or use drugs.  Allergies  Allergen Reactions  . Penicillins     No family history on file.  Prior to Admission medications   Medication Sig Start Date End Date Taking? Authorizing Provider  albuterol (PROVENTIL HFA;VENTOLIN HFA) 108 (90 BASE) MCG/ACT inhaler Inhale 2 puffs into the lungs every 4 (four) hours as needed for wheezing or shortness of breath. 05/13/11 11/04/15  Charlena Cross, MD  albuterol (PROVENTIL) (2.5 MG/3ML) 0.083% nebulizer solution Take 2.5 mg by nebulization every 6 (six) hours as needed.    [provider]  amLODipine (NORVASC) 10 MG tablet Take 10 mg by mouth daily.    [provider]  cyclobenzaprine (FLEXERIL) 5 MG tablet Take 5 mg by mouth 3 (three) times daily as needed.    [provider]  diclofenac sodium (VOLTAREN) 1 % GEL Apply 2 g topically 4 (four) times daily. 06/02/16   Ward, Ozella Almond, PA-C  enalapril (VASOTEC) 10 MG  tablet Take 10 mg by mouth daily.    [provider]  hydroxypropyl methylcellulose / hypromellose  (ISOPTO TEARS / GONIOVISC) 2.5 % ophthalmic solution Place 1 drop into both eyes 3 (three) times daily as needed for dry eyes. 11/04/15   Nona Dell, PA-C  mometasone-formoterol (DULERA) 100-5 MCG/ACT AERO Inhale 2 puffs into the lungs 2 (two) times daily.    [provider]  naphazoline-pheniramine (NAPHCON-A) 0.025-0.3 % ophthalmic solution Place 1 drop into both eyes every 4 (four) hours as needed for irritation. 11/04/15   Nona Dell, PA-C  OXYGEN Inhale into the lungs.    [provider]    Physical Exam: Vitals:   02/01/18 1704 02/01/18 1724 02/01/18 1800 02/01/18 1901  BP: (!) 182/70 (!) 197/71 (!) 164/73 (!) 147/64  Pulse:  75 83 78  Resp:  18 (!) 27   Temp:  97.8 F (36.6 C)  98.7 F (37.1 C)  TempSrc:  Oral  Oral  SpO2:  100% 100% 100%  Weight:      Height:        GENERAL: 77 y.o.-year-old female patient, well-developed, well-nourished lying in the bed in no acute distress.  Pleasant and cooperative.   HEENT: Head atraumatic, normocephalic. Pupils equal. Mucus membranes moist. NECK: Supple. No JVD. CHEST: Shallow respirations with diffuse expiratory wheezing. No use of accessory muscles of respiration.  No reproducible chest wall tenderness.  CARDIOVASCULAR: S1, S2 normal. No murmurs, rubs, or gallops. Cap refill <2 seconds. Pulses intact distally.  ABDOMEN: Soft, nondistended, nontender. No rebound, guarding, rigidity. Normoactive bowel sounds present in all four quadrants.  EXTREMITIES: No pedal edema, cyanosis, or clubbing. No calf tenderness or Homan's sign.  NEUROLOGIC: The patient is alert and oriented x 3. Cranial nerves II through XII are grossly intact with no focal sensorimotor deficit. PSYCHIATRIC:  Normal affect, mood, thought content. SKIN: Warm, dry, and intact without obvious rash, lesion, or ulcer.    Labs on Admission:  CBC: Recent Labs  Lab 02/01/18 1329  WBC 5.2  HGB 10.9*  HCT 34.9*  MCV 84.9  PLT 295    Basic Metabolic Panel: Recent Labs  Lab 02/01/18 1329  NA 139  K 4.0  CL 101  CO2 30  GLUCOSE 103*  BUN 9  CREATININE 0.68  CALCIUM 9.4   GFR: Estimated Creatinine Clearance: 40.7 mL/min (by C-G formula based on SCr of 0.68 mg/dL). Liver Function Tests: Recent Labs  Lab 02/01/18 1329  AST 26  ALT 20  ALKPHOS 94  BILITOT 0.4  PROT 8.3*  ALBUMIN 3.8   No results for input(s): LIPASE, AMYLASE in the last 168 hours. No results for input(s): AMMONIA in the last 168 hours. Coagulation Profile: Recent Labs  Lab 02/01/18 1329  INR 1.04   Cardiac Enzymes: Recent Labs  Lab 02/01/18 1329  TROPONINI <0.03   BNP (last 3 results) No results for input(s): PROBNP in the last 8760 hours. HbA1C: No results for input(s): HGBA1C in the last 72 hours. CBG: No results for input(s): GLUCAP in the last 168 hours. Lipid Profile: No results for input(s): CHOL, HDL, LDLCALC, TRIG, CHOLHDL, LDLDIRECT in the last 72 hours. Thyroid Function Tests: No results for input(s): TSH, T4TOTAL, FREET4, T3FREE, THYROIDAB in the last 72 hours. Anemia Panel: No results for input(s): VITAMINB12, FOLATE, FERRITIN, TIBC, IRON, RETICCTPCT in the last 72 hours. Urine analysis: No results found for: COLORURINE, APPEARANCEUR, LABSPEC, PHURINE, GLUCOSEU, HGBUR, BILIRUBINUR, KETONESUR, PROTEINUR, UROBILINOGEN, NITRITE, LEUKOCYTESUR Sepsis Labs: @LABRCNTIP (procalcitonin:4,lacticidven:4) )No results  found for this or any previous visit (from the past 240 hour(s)).   Radiological Exams on Admission: Dg Chest 2 View  Result Date: 02/01/2018 CLINICAL DATA:  Shortness breath.  Hematochezia.  Cough. EXAM: CHEST - 2 VIEW COMPARISON:  CT of the chest 06/02/2016 FINDINGS: Heart size is normal. Aortic atherosclerosis is present. Left perihilar mass. A left perihilar mass measures up to 4.3 cm. Ill-defined airspace disease lateral to this area may reflect postobstructive pneumonitis. No other discrete airspace  disease is present. The lungs are hyperinflated. Visualized soft tissues and bony thorax are unremarkable. IMPRESSION: 1. Left perihilar mass lesion concerning for neoplasm. Recommend CT of the chest with contrast. 2. Airspace disease lateral to the mass lesion may reflect post obstructive disease. 3. Aortic atherosclerosis. These results were called by telephone at the time of interpretation on 02/01/2018 at 2:10 pm to Dr. Dorie Rank , who verbally acknowledged these results. Electronically Signed   By: San Morelle M.D.   On: 02/01/2018 14:12   Ct Chest W Contrast  Result Date: 02/01/2018 CLINICAL DATA:  Evaluate lung mass. EXAM: CT CHEST WITH CONTRAST TECHNIQUE: Multidetector CT imaging of the chest was performed during intravenous contrast administration. CONTRAST:  56mL ISOVUE-300 IOPAMIDOL (ISOVUE-300) INJECTION 61% COMPARISON:  06/02/2016 and 04/01/2014 FINDINGS: Cardiovascular: Heart size appears normal. No pericardial effusion. Aortic atherosclerosis. Calcification in the LAD, left circumflex and RCA coronary artery noted. Mediastinum/Nodes: Normal appearance of the thyroid gland. The trachea appears patent and is midline. Normal appearance of the esophagus. Left paratracheal lymph node measures 8 mm, image 66/2. Previously 6 mm. No right paratracheal or subcarinal adenopathy. No axillary or supraclavicular adenopathy. Lungs/Pleura: Moderate to advanced changes of emphysema. Left upper lobe lung mass with left hilar invasion is identified. This measures 4.1 by 3.5 by 4.2 cm. There is encasement and narrowing of the left upper lobe and lingular bronchi. The mass involves the oblique fissure and extends into the left lower lobe, image 81/3. Postobstructive pneumonitis within the posterior and lateral left upper lobe is identified. Left upper lobe pulmonary nodule measures 3 mm, image 50/3. Unchanged when compared with 06/02/2016. Right middle lobe pulmonary nodule measures 0.9 cm, image 107/3 and  is unchanged from 04/01/2014. Upper Abdomen: No acute abnormality. Musculoskeletal: No chest wall abnormality. No acute or significant osseous findings. IMPRESSION: 1. Within the central left upper lobe there is a mass measuring 4.1 cm which invades the left hilum. There is involvement of the oblique fissure and left lower lobe as well as postobstructive pneumonitis within the posterior and lateral left upper lobe. Highly worrisome for primary bronchogenic carcinoma. Further evaluation with PET-CT is advised. 2. Pulmonary nodule within the right middle lobe is unchanged measuring 9 mm. This is been present since 04/01/2014 and likely represents a benign process. 3. Aortic Atherosclerosis (ICD10-I70.0) and Emphysema (ICD10-J43.9). 4. Multi vessel coronary artery atherosclerotic calcifications. Electronically Signed   By: Kerby Moors M.D.   On: 02/01/2018 16:06    EKG: Normal sinus rhythm at 63 bpm with normal axis and nonspecific ST-T wave changes.   Assessment/Plan  This is a 77 y.o. 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  Hemoptysis likely secondary to left hilar lung mass with postobstructive pneumonitis/possible pneumonia Admit to inpatient Pulmonary consultation will be required for consideration of bronchoscopy/biopsy as well as PET CT scan. We will continue Levaquin, steroids and nebulizer treatment.  #.  History of COPD We will continue Trelegy  #.  History of hypertension Continue enalapril   Admission status: Inpatient IV Fluids: Normal saline Diet/Nutrition: Heart healthy Consults called: We will call pulmonary in a.m. DVT Px: Lovenox, SCDs and early ambulation. Code Status: Full Code  Disposition Plan: To home in 1-2 days  All the records are reviewed and case discussed with ED provider. Management plans discussed with the patient  and/or family who express understanding and agree with plan of care.  Opha Mcghee D.O. on 02/01/2018 at 7:38 PM CC: Primary care physician; System, Pcp Not In   02/01/2018, 7:38 PM

## 2018-02-01 NOTE — ED Notes (Signed)
Pt remains on monitor and auto VS

## 2018-02-01 NOTE — ED Notes (Signed)
Patient transported to CT 

## 2018-02-01 NOTE — ED Notes (Signed)
ED Provider at bedside. 

## 2018-02-01 NOTE — ED Notes (Addendum)
Report given to [place] with [facility] , called floor to give report to receiving  nurse, unavailable

## 2018-02-01 NOTE — ED Notes (Signed)
Report given to [employer], receiving nurse at [facility]

## 2018-02-01 NOTE — ED Provider Notes (Signed)
[facility] EMERGENCY DEPARTMENT Provider Note   CSN: [id number] Arrival date & time: 02/01/18  1307     History   Chief Complaint Chief Complaint  Patient presents with  . Hemoptysis    HPI Donna Cross is a 77 y.o. female.  HPI Patient has a history of COPD.  She is had some increased cough and sputum production the last couple of weeks.  The last couple of days she started to notice some pink color to her sputum.  This morning it was more bright red.  She denies having any chest pain.  She has been feeling more short of breath.  She denies any fevers.  No leg pain or leg swelling.  Patient states she has a history of PE many years ago but is not currently on any anticoagulation. Past Medical History:  Diagnosis Date  . Asthma   . COPD (chronic obstructive pulmonary disease) ([place])   . Emphysema   . Hypertension     Patient Active Problem List   Diagnosis Date Noted  . HYPERLIPIDEMIA 07/10/2007  . HYPERTENSION 07/10/2007  . EMPHYSEMA 07/10/2007  . 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 OSTEOPENIA 07/10/2007    History reviewed. No pertinent surgical history.   OB History   None      Home Medications    Prior to Admission medications   Medication Sig Start Date End Date Taking? Authorizing Provider  albuterol (PROVENTIL HFA;VENTOLIN HFA) 108 (90 BASE) MCG/ACT inhaler Inhale 2 puffs into the lungs every 4 (four) hours as needed for wheezing or shortness of breath. 05/13/11 11/04/15  Charlena Cross, MD  albuterol (PROVENTIL) (2.5 MG/3ML) 0.083% nebulizer solution Take 2.5 mg by nebulization every 6 (six) hours as needed.    [provider]  amLODipine (NORVASC) 10 MG tablet Take 10 mg by mouth daily.    [provider]  cyclobenzaprine (FLEXERIL) 5 MG tablet Take 5 mg by mouth 3 (three) times daily as needed.    [provider]  diclofenac sodium (VOLTAREN) 1 % GEL Apply 2 g topically 4 (four) times daily. 06/02/16   Ward, Ozella Almond, PA-C  enalapril (VASOTEC) 10 MG tablet Take 10 mg by mouth daily.    [provider]  hydroxypropyl methylcellulose / hypromellose (ISOPTO TEARS / GONIOVISC) 2.5 % ophthalmic solution Place 1 drop into both eyes 3 (three) times daily as needed for dry eyes. 11/04/15   Nona Dell, PA-C  mometasone-formoterol (DULERA) 100-5 MCG/ACT AERO Inhale 2 puffs into the lungs 2 (two) times daily.    [provider]  naphazoline-pheniramine (NAPHCON-A) 0.025-0.3 % ophthalmic solution Place 1 drop into both eyes every 4 (four) hours as needed for irritation. 11/04/15   Nona Dell, PA-C  OXYGEN Inhale into the lungs.    [provider]    Family History No family history on file.  Social History Social History   Tobacco Use  . Smoking status: Former Research scientist (life sciences)  . Smokeless tobacco: Never Used  Substance Use Topics  . Alcohol use: No  . Drug use: No     Allergies   Penicillins   Review of Systems Review of Systems  Constitutional: Negative for fever.  Respiratory: Positive for shortness of breath.   Cardiovascular: Negative for chest pain.  Gastrointestinal: Negative for abdominal pain.  Genitourinary: Negative for dysuria.  Hematological: Does not bruise/bleed easily.     Physical Exam Updated Vital Signs BP (!) 181/77   Pulse 71   Temp 98.5 F (  36.9 C) (Oral)   Resp (!) 25   Ht 1.461 m (4' 9.5")   Wt 48.1 kg   SpO2 100%   BMI 22.54 kg/m   Physical Exam  Constitutional: No distress.  Frail, elderly  HENT:  Head: Normocephalic and atraumatic.  Right Ear: External ear normal.  Left Ear: External ear normal.  Eyes: Conjunctivae are normal. Right eye exhibits no discharge. Left eye exhibits no discharge. No scleral icterus.  Neck: Neck supple. No tracheal deviation present.  Cardiovascular: Normal rate, regular rhythm and intact distal pulses.  Pulmonary/Chest: Effort normal. No stridor. No respiratory distress. She  has wheezes. She has no rales.  Abdominal: Soft. Bowel sounds are normal. She exhibits no distension. There is no tenderness. There is no rebound and no guarding.  Musculoskeletal: She exhibits no edema or tenderness.  Neurological: She is alert. She has normal strength. No cranial nerve deficit (no facial droop, extraocular movements intact, no slurred speech) or sensory deficit. She exhibits normal muscle tone. She displays no seizure activity. Coordination normal.  Skin: Skin is warm and dry. No rash noted.  Psychiatric: She has a normal mood and affect.  Nursing note and vitals reviewed.    ED Treatments / Results  Labs (all labs ordered are listed, but only abnormal results are displayed) Labs Reviewed  COMPREHENSIVE METABOLIC PANEL - Abnormal; Notable for the following components:      Result Value   Glucose, Bld 103 (*)    Total Protein 8.3 (*)    All other components within normal limits  CBC - Abnormal; Notable for the following components:   Hemoglobin 10.9 (*)    HCT 34.9 (*)    All other components within normal limits  PROTIME-INR  TROPONIN I  BRAIN NATRIURETIC PEPTIDE    EKG EKG Interpretation  Date/Time:  Sunday February 01 2018 13:25:15 EDT Ventricular Rate:  63 PR Interval:    QRS Duration: 81 QT Interval:  438 QTC Calculation: 449 R Axis:   83 Text Interpretation:  Sinus rhythm Anterior infarct, old No significant change since last tracing Confirmed by Dorie Rank [phone]) on 02/01/2018 1:29:42 PM Also confirmed by Dorie Rank [phone]), editor Shon Hale [phone])  on 02/01/2018 2:43:13 PM   Radiology Dg Chest 2 View  Result Date: 02/01/2018 CLINICAL DATA:  Shortness breath.  Hematochezia.  Cough. EXAM: CHEST - 2 VIEW COMPARISON:  CT of the chest 06/02/2016 FINDINGS: Heart size is normal. Aortic atherosclerosis is present. Left perihilar mass. A left perihilar mass measures up to 4.3 cm. Ill-defined airspace disease lateral to this area may reflect  postobstructive pneumonitis. No other discrete airspace disease is present. The lungs are hyperinflated. Visualized soft tissues and bony thorax are unremarkable. IMPRESSION: 1. Left perihilar mass lesion concerning for neoplasm. Recommend CT of the chest with contrast. 2. Airspace disease lateral to the mass lesion may reflect post obstructive disease. 3. Aortic atherosclerosis. These results were called by telephone at the time of interpretation on 02/01/2018 at 2:10 pm to Dr. Dorie Rank , who verbally acknowledged these results. Electronically Signed   By: San Morelle M.D.   On: 02/01/2018 14:12   Ct Chest W Contrast  Result Date: 02/01/2018 CLINICAL DATA:  Evaluate lung mass. EXAM: CT CHEST WITH CONTRAST TECHNIQUE: Multidetector CT imaging of the chest was performed during intravenous contrast administration. CONTRAST:  42mL ISOVUE-300 IOPAMIDOL (ISOVUE-300) INJECTION 61% COMPARISON:  06/02/2016 and 04/01/2014 FINDINGS: Cardiovascular: Heart size appears normal. No pericardial effusion. Aortic atherosclerosis. Calcification in the LAD, left  circumflex and RCA coronary artery noted. Mediastinum/Nodes: Normal appearance of the thyroid gland. The trachea appears patent and is midline. Normal appearance of the esophagus. Left paratracheal lymph node measures 8 mm, image 66/2. Previously 6 mm. No right paratracheal or subcarinal adenopathy. No axillary or supraclavicular adenopathy. Lungs/Pleura: Moderate to advanced changes of emphysema. Left upper lobe lung mass with left hilar invasion is identified. This measures 4.1 by 3.5 by 4.2 cm. There is encasement and narrowing of the left upper lobe and lingular bronchi. The mass involves the oblique fissure and extends into the left lower lobe, image 81/3. Postobstructive pneumonitis within the posterior and lateral left upper lobe is identified. Left upper lobe pulmonary nodule measures 3 mm, image 50/3. Unchanged when compared with 06/02/2016. Right middle  lobe pulmonary nodule measures 0.9 cm, image 107/3 and is unchanged from 04/01/2014. Upper Abdomen: No acute abnormality. Musculoskeletal: No chest wall abnormality. No acute or significant osseous findings. IMPRESSION: 1. Within the central left upper lobe there is a mass measuring 4.1 cm which invades the left hilum. There is involvement of the oblique fissure and left lower lobe as well as postobstructive pneumonitis within the posterior and lateral left upper lobe. Highly worrisome for primary bronchogenic carcinoma. Further evaluation with PET-CT is advised. 2. Pulmonary nodule within the right middle lobe is unchanged measuring 9 mm. This is been present since 04/01/2014 and likely represents a benign process. 3. Aortic Atherosclerosis (ICD10-I70.0) and Emphysema (ICD10-J43.9). 4. Multi vessel coronary artery atherosclerotic calcifications. Electronically Signed   By: Kerby Moors M.D.   On: 02/01/2018 16:06    Procedures Procedures (including critical care time)  Medications Ordered in ED Medications  ipratropium-albuterol (DUONEB) 0.5-2.5 (3) MG/3ML nebulizer solution 3 mL (3 mLs Nebulization Given 02/01/18 1327)  albuterol (PROVENTIL) (2.5 MG/3ML) 0.083% nebulizer solution 2.5 mg (2.5 mg Nebulization Given 02/01/18 1327)  predniSONE (DELTASONE) tablet 60 mg (60 mg Oral Given 02/01/18 1348)  iopamidol (ISOVUE-300) 61 % injection 100 mL (80 mLs Intravenous Contrast Given 02/01/18 1512)     Initial Impression / Assessment and Plan / ED Course  I have reviewed the triage vital signs and the nursing notes.  Pertinent labs & imaging results that were available during my care of the patient were reviewed by me and considered in my medical decision making (see chart for details).  Clinical Course as of Feb 01 1626  Sun Feb 01, 2018  1413 2-5 by Dr. Jobe Igo that chest x-ray shows probable lung mass.  CT scan recommended   [JK]    Clinical Course User Index [JK] Dorie Rank, MD    Pt  presented to the emergency room with complaints of shortness of breath and hemoptysis.  Patient has history of COPD.  She was noted to be wheezing on exam.  Labs did not show any evidence of cardiac ischemia or heart failure.  Though anemia associated with hemoptysis.  Patient was breathing treatments and antibiotics.  Patient does continue to feel short of breath and is tachypneic.  CT scan shows pulmonary mass as well as obstructive pneumonitis.  Will consult for admission, further treatment.   Final Clinical Impressions(s) / ED Diagnoses   Final diagnoses:  Pneumonitis  Lung mass  Hemoptysis      Dorie Rank, MD 02/01/18 [phone]

## 2018-02-01 NOTE — ED Triage Notes (Signed)
Pt reports "spitting up blood since yesterday". She is on 4L home oxygen. O2 sats low 80s in triage. She has her concentrator with her but turned it off "so it wouldn't run out". RT at bedside to eval

## 2018-02-01 NOTE — Progress Notes (Signed)
PHARMACY NOTE:  ANTIMICROBIAL RENAL DOSAGE ADJUSTMENT  Current antimicrobial regimen includes a mismatch between antimicrobial dosage and estimated renal function.  As per policy approved by the Pharmacy & Therapeutics and Medical Executive Committees, the antimicrobial dosage will be adjusted accordingly.  Current antimicrobial dosage:  levaquin 500 mg IV q24h  Indication: PNA  Renal Function:  Estimated Creatinine Clearance: 40.7 mL/min (by C-G formula based on SCr of 0.68 mg/dL). []      On intermittent HD, scheduled: []      On CRRT    Antimicrobial dosage has been changed to:  Levaquin 750 mg IV q48h  Additional comments:   Thank you for allowing pharmacy to be a part of this patient's care.  Lynelle Doctor, [facility] 02/01/2018 8:49 PM

## 2018-02-02 ENCOUNTER — Other Ambulatory Visit: Payer: Self-pay

## 2018-02-02 DIAGNOSIS — R042 Hemoptysis: Secondary | ICD-10-CM

## 2018-02-02 DIAGNOSIS — J441 Chronic obstructive pulmonary disease with (acute) exacerbation: Principal | ICD-10-CM

## 2018-02-02 DIAGNOSIS — J189 Pneumonia, unspecified organism: Secondary | ICD-10-CM | POA: Diagnosis present

## 2018-02-02 DIAGNOSIS — R918 Other nonspecific abnormal finding of lung field: Secondary | ICD-10-CM

## 2018-02-02 LAB — BASIC METABOLIC PANEL
Anion gap: 5 (ref 5–15)
BUN: 9 mg/dL (ref 8–23)
CO2: 29 mmol/L (ref 22–32)
Calcium: 8.9 mg/dL (ref 8.9–10.3)
Chloride: 106 mmol/L (ref 98–111)
Creatinine, Ser: 0.54 mg/dL (ref 0.44–1.00)
GFR calc Af Amer: >60 mL/min (ref >60)
GFR calc non Af Amer: >60 mL/min (ref >60)
Glucose, Bld: 112 mg/dL — ABNORMAL HIGH (ref 70–99)
Potassium: 4.2 mmol/L (ref 3.5–5.1)
Sodium: 140 mmol/L (ref 135–145)

## 2018-02-02 LAB — CBC
HCT: 30.6 % — ABNORMAL LOW (ref 36.0–46.0)
Hemoglobin: 9.4 g/dL — ABNORMAL LOW (ref 12.0–15.0)
MCH: 26.7 pg (ref 26.0–34.0)
MCHC: 30.7 g/dL (ref 30.0–36.0)
MCV: 86.9 fL (ref 80.0–100.0)
Platelets: 291 10*3/uL (ref 150–400)
RBC: 3.52 MIL/uL — ABNORMAL LOW (ref 3.87–5.11)
RDW: 13.5 % (ref 11.5–15.5)
WBC: 5 10*3/uL (ref 4.0–10.5)
nRBC: 0 % (ref 0.0–0.2)

## 2018-02-02 MED ORDER — LIP MEDEX EX OINT
1.0000 "application " | TOPICAL_OINTMENT | CUTANEOUS | Status: DC | PRN
  Filled 2018-02-02: qty 7

## 2018-02-02 MED ORDER — TIOTROPIUM BROMIDE MONOHYDRATE 18 MCG IN CAPS: 18.0000 ug | ORAL_CAPSULE | Freq: Every day | RESPIRATORY_TRACT | Status: DC

## 2018-02-02 MED ORDER — MOMETASONE FURO-FORMOTEROL FUM 100-5 MCG/ACT IN AERO
2.0000 | INHALATION_SPRAY | Freq: Two times a day (BID) | RESPIRATORY_TRACT | Status: DC
  Administered 2018-02-03 – 2018-02-04 (×3): 2 via RESPIRATORY_TRACT
  Filled 2018-02-02: qty 8.8

## 2018-02-02 MED ORDER — UMECLIDINIUM BROMIDE 62.5 MCG/INH IN AEPB
1.0000 | INHALATION_SPRAY | Freq: Every day | RESPIRATORY_TRACT | Status: DC
  Administered 2018-02-02 – 2018-02-04 (×3): 1 via RESPIRATORY_TRACT
  Filled 2018-02-02: qty 7

## 2018-02-02 MED ORDER — LEVOFLOXACIN 750 MG PO TABS
750.0000 mg | ORAL_TABLET | ORAL | Status: DC
  Administered 2018-02-03: 750 mg via ORAL
  Filled 2018-02-02: qty 1

## 2018-02-02 MED ORDER — PREDNISONE 20 MG PO TABS
40.0000 mg | ORAL_TABLET | Freq: Every day | ORAL | Status: DC
  Administered 2018-02-02 – 2018-02-04 (×3): 40 mg via ORAL
  Filled 2018-02-02 (×3): qty 2

## 2018-02-02 MED ORDER — IPRATROPIUM-ALBUTEROL 0.5-2.5 (3) MG/3ML IN SOLN: 3.0000 mL | Freq: Four times a day (QID) | RESPIRATORY_TRACT | Status: DC | PRN

## 2018-02-02 MED ORDER — ALBUTEROL SULFATE (2.5 MG/3ML) 0.083% IN NEBU
INHALATION_SOLUTION | RESPIRATORY_TRACT | Status: AC
  Filled 2018-02-02: qty 3

## 2018-02-02 MED ORDER — ORAL CARE MOUTH RINSE
15.0000 mL | Freq: Two times a day (BID) | OROMUCOSAL | Status: DC
  Administered 2018-02-03 – 2018-02-04 (×3): 15 mL via OROMUCOSAL

## 2018-02-02 NOTE — Plan of Care (Signed)
  Problem: Clinical Measurements: Goal: Diagnostic test results will improve Outcome: Progressing   

## 2018-02-02 NOTE — Progress Notes (Signed)
PROGRESS NOTE  AISHAH Cross NAT:[id number] DOB: 09/25/1940 DOA: 02/01/2018 PCP: System, Pcp Not In  HPI/Brief Narrative  Donna Cross is a 77 y.o. year old female with medical history significant for COPD, 40 pack year smoking history ( quit 20 years ago),  HTN, HLD who presented on 02/01/2018 with weeks of progressively worsening dyspnea and new onset hemoptysis and was found to have lung mass concerning for primary lung malignancy and COPD exacerbation complicated by postobstructive pneumonia.  Subjective No sob, no chest pain. Still coughing up scant amounts of blood.   Assessment/Plan:  #COPD with acute exacerbation.  With some intermittent wheezing likely related to postobstructive pneumonia.  Doing well on home oxygen requirements.  Continue prednisone, Spiriva added by pulm, nebs as needed.  #Post obstruction pneumonia, stable.  Stable oxygen requirement.  Switch to oral Levaquin from IV, if cough becomes productive will obtain sputum culture  #Left upper lobe mass with invasion of left hilum, worrisome for primary bronchogenic carcinoma.  Demonstrated chest x-ray and confirmed on CT chest.  Pulmonary consulted, discussion with patient and family they defer bronchoscopy international to 1 to go forth with chemotherapy or radiation.  #Hemoptysis.  Likely secondary to mass as mentioned above.  Very concerning for primary lung malignancy.  Hemoglobin currently stable with no other signs or symptoms of bleeding.  #Chronic hypoxic respiratory failure secondary to COPD.  Home O2 requirements of 3 L, currently maintained on that.  We will continue to monitor  Hypertension, controlled.  Continue home enalapril.  Code Status: Full Code   Family Communication: family updated at bedside   Disposition Plan: family discussing if want to consider treatment of possible malignancy versus palliative/care. Switch to oral levaquin monitor COPD on prednisone and change in inhaler regimen.      Consultants:  Pulm     Procedures:  none   Antimicrobials: Anti-infectives (From admission, onward)   Start     Dose/Rate Route Frequency Ordered Stop   02/02/18 1600  levofloxacin (LEVAQUIN) IVPB 750 mg     750 mg 100 mL/hr over 90 Minutes Intravenous Every 48 hours 02/01/18 2050     02/01/18 2100  levofloxacin (LEVAQUIN) IVPB 750 mg  Status:  Discontinued     750 mg 100 mL/hr over 90 Minutes Intravenous Every 48 hours 02/01/18 2049 02/01/18 2050   02/01/18 2045  levofloxacin (LEVAQUIN) IVPB 500 mg  Status:  Discontinued     500 mg 100 mL/hr over 60 Minutes Intravenous Every 24 hours 02/01/18 2034 02/01/18 2048   02/01/18 1630  levofloxacin (LEVAQUIN) IVPB 500 mg     500 mg 100 mL/hr over 60 Minutes Intravenous  Once 02/01/18 1627 02/01/18 1747         Cultures:  none  Telemetry:non  DVT prophylaxis:  lovenox   Objective: Vitals:   02/01/18 1901 02/01/18 2204 02/02/18 0500 02/02/18 0935  BP: (!) 147/64  119/67   Pulse: 78  (!) 57   Resp:   16   Temp: 98.7 F (37.1 C)  97.8 F (36.6 C)   TempSrc: Oral  Oral   SpO2: 100% 98% 99% 96%  Weight:      Height:        Intake/Output Summary (Last 24 hours) at 02/02/2018 1013 Last data filed at 02/02/2018 0932 Gross per 24 hour  Intake 643.01 ml  Output 150 ml  Net 493.01 ml   Filed Weights   02/01/18 1322  Weight: 48.1 kg    Exam:  Constitutional:elderly  female, no distress Eyes: EOMI, anicteric, normal conjunctivae ENMT: Oropharynx with moist mucous membranes, normal dentition Cardiovascular: RRR no MRGs, with no peripheral edema Respiratory: Normal respiratory effort on 3 L [place], diminished breath sounds throughout Abdomen: Soft,non-tender,  Skin: No rash ulcers, or lesions. Without skin tenting  Neurologic: Grossly no focal neuro deficit. Psychiatric:Appropriate affect, and mood. Mental status AAOx3  Data Reviewed: CBC: Recent Labs  Lab 02/01/18 1329 02/02/18 0526  WBC 5.2 5.0  HGB  10.9* 9.4*  HCT 34.9* 30.6*  MCV 84.9 86.9  PLT 301 191   Basic Metabolic Panel: Recent Labs  Lab 02/01/18 1329 02/02/18 0526  NA 139 140  K 4.0 4.2  CL 101 106  CO2 30 29  GLUCOSE 103* 112*  BUN 9 9  CREATININE 0.68 0.54  CALCIUM 9.4 8.9   GFR: Estimated Creatinine Clearance: 40.7 mL/min (by C-G formula based on SCr of 0.54 mg/dL). Liver Function Tests: Recent Labs  Lab 02/01/18 1329  AST 26  ALT 20  ALKPHOS 94  BILITOT 0.4  PROT 8.3*  ALBUMIN 3.8   No results for input(s): LIPASE, AMYLASE in the last 168 hours. No results for input(s): AMMONIA in the last 168 hours. Coagulation Profile: Recent Labs  Lab 02/01/18 1329  INR 1.04   Cardiac Enzymes: Recent Labs  Lab 02/01/18 1329  TROPONINI <0.03   BNP (last 3 results) No results for input(s): PROBNP in the last 8760 hours. HbA1C: No results for input(s): HGBA1C in the last 72 hours. CBG: No results for input(s): GLUCAP in the last 168 hours. Lipid Profile: No results for input(s): CHOL, HDL, LDLCALC, TRIG, CHOLHDL, LDLDIRECT in the last 72 hours. Thyroid Function Tests: No results for input(s): TSH, T4TOTAL, FREET4, T3FREE, THYROIDAB in the last 72 hours. Anemia Panel: No results for input(s): VITAMINB12, FOLATE, FERRITIN, TIBC, IRON, RETICCTPCT in the last 72 hours. Urine analysis: No results found for: COLORURINE, APPEARANCEUR, LABSPEC, PHURINE, GLUCOSEU, HGBUR, BILIRUBINUR, KETONESUR, PROTEINUR, UROBILINOGEN, NITRITE, LEUKOCYTESUR Sepsis Labs: @LABRCNTIP (procalcitonin:4,lacticidven:4)  )No results found for this or any previous visit (from the past 240 hour(s)).    Studies: Dg Chest 2 View  Result Date: 02/01/2018 CLINICAL DATA:  Shortness breath.  Hematochezia.  Cough. EXAM: CHEST - 2 VIEW COMPARISON:  CT of the chest 06/02/2016 FINDINGS: Heart size is normal. Aortic atherosclerosis is present. Left perihilar mass. A left perihilar mass measures up to 4.3 cm. Ill-defined airspace disease  lateral to this area may reflect postobstructive pneumonitis. No other discrete airspace disease is present. The lungs are hyperinflated. Visualized soft tissues and bony thorax are unremarkable. IMPRESSION: 1. Left perihilar mass lesion concerning for neoplasm. Recommend CT of the chest with contrast. 2. Airspace disease lateral to the mass lesion may reflect post obstructive disease. 3. Aortic atherosclerosis. These results were called by telephone at the time of interpretation on 02/01/2018 at 2:10 pm to Dr. Dorie Rank , who verbally acknowledged these results. Electronically Signed   By: San Morelle M.D.   On: 02/01/2018 14:12   Ct Chest W Contrast  Result Date: 02/01/2018 CLINICAL DATA:  Evaluate lung mass. EXAM: CT CHEST WITH CONTRAST TECHNIQUE: Multidetector CT imaging of the chest was performed during intravenous contrast administration. CONTRAST:  13mL ISOVUE-300 IOPAMIDOL (ISOVUE-300) INJECTION 61% COMPARISON:  06/02/2016 and 04/01/2014 FINDINGS: Cardiovascular: Heart size appears normal. No pericardial effusion. Aortic atherosclerosis. Calcification in the LAD, left circumflex and RCA coronary artery noted. Mediastinum/Nodes: Normal appearance of the thyroid gland. The trachea appears patent and is midline. Normal appearance of the  esophagus. Left paratracheal lymph node measures 8 mm, image 66/2. Previously 6 mm. No right paratracheal or subcarinal adenopathy. No axillary or supraclavicular adenopathy. Lungs/Pleura: Moderate to advanced changes of emphysema. Left upper lobe lung mass with left hilar invasion is identified. This measures 4.1 by 3.5 by 4.2 cm. There is encasement and narrowing of the left upper lobe and lingular bronchi. The mass involves the oblique fissure and extends into the left lower lobe, image 81/3. Postobstructive pneumonitis within the posterior and lateral left upper lobe is identified. Left upper lobe pulmonary nodule measures 3 mm, image 50/3. Unchanged when  compared with 06/02/2016. Right middle lobe pulmonary nodule measures 0.9 cm, image 107/3 and is unchanged from 04/01/2014. Upper Abdomen: No acute abnormality. Musculoskeletal: No chest wall abnormality. No acute or significant osseous findings. IMPRESSION: 1. Within the central left upper lobe there is a mass measuring 4.1 cm which invades the left hilum. There is involvement of the oblique fissure and left lower lobe as well as postobstructive pneumonitis within the posterior and lateral left upper lobe. Highly worrisome for primary bronchogenic carcinoma. Further evaluation with PET-CT is advised. 2. Pulmonary nodule within the right middle lobe is unchanged measuring 9 mm. This is been present since 04/01/2014 and likely represents a benign process. 3. Aortic Atherosclerosis (ICD10-I70.0) and Emphysema (ICD10-J43.9). 4. Multi vessel coronary artery atherosclerotic calcifications. Electronically Signed   By: Kerby Moors M.D.   On: 02/01/2018 16:06    Scheduled Meds: . amLODipine  10 mg Oral Daily  . diclofenac sodium  2 g Topical QID  . enalapril  10 mg Oral Daily  . enoxaparin (LOVENOX) injection  30 mg Subcutaneous Q24H  . mometasone-formoterol  2 puff Inhalation BID    Continuous Infusions: . sodium chloride 75 mL/hr at 02/02/18 0200  . levofloxacin (LEVAQUIN) IV       LOS: 1 day     Desiree Hane, MD [facility] Pager [phone]  If 7PM-7AM, please contact night-coverage [email or web address] Password [email or web address] 02/02/2018, 10:13 AM

## 2018-02-02 NOTE — Progress Notes (Signed)
Pt. refused CPAP for this evening, made aware to notify if needed, RN made aware.

## 2018-02-02 NOTE — Progress Notes (Addendum)
NAME:  Donna Cross, MRN:  [id number], DOB:  1940-05-02, LOS: 1 ADMISSION DATE:  02/01/2018, CONSULTATION DATE:  02/02/18 REFERRING MD:  Theodoro Grist MD, CHIEF COMPLAINT: Hemoptysis, lung mass  Brief History   77 year old ex-smoker with severe COPD on 4 L home oxygen.  Admitted with cough, hemoptysis.  Found to have left hilar mass.  PCCM called for consultation.  Past Medical History  Hypertension, Severe COPD ( FEV1 of 0.6 8 L, 34% in 2007), 40-50-pack-year smoker.  Quit more than 15 years ago Follow with Dr Vonzella Nipple, Pulmonary at [facility]     Consults: date of consult/date signed off & final recs:  Pulmonary 02/02/2018-  Procedures (surgical and bedside):    Significant Diagnostic Tests:  CT chest 02/01/2018- left upper lobe mass measuring 4.1 cm with left hilar invasion, postobstructive pneumonitis.  Stable pulmonary nodule in the right middle lobe.  Aortic, coronary atherosclerosis, emphysema.  I have reviewed the images personally.  Micro Data:    Antimicrobials:  Levofloxacin 10/20 >  Subjective:    Objective   Blood pressure 119/67, pulse (!) 57, temperature 97.8 F (36.6 C), temperature source Oral, resp. rate 16, height 4' 9.5" (1.461 m), weight 48.1 kg, SpO2 96 %.        Intake/Output Summary (Last 24 hours) at 02/02/2018 1038 Last data filed at 02/02/2018 0932 Gross per 24 hour  Intake 643.01 ml  Output 150 ml  Net 493.01 ml   Filed Weights   02/01/18 1322  Weight: 48.1 kg    Examination: Blood pressure 119/67, pulse (!) 57, temperature 97.8 F (36.6 C), temperature source Oral, resp. rate 16, height 4' 9.5" (1.461 m), weight 48.1 kg, SpO2 96 %. Gen:      No acute distress, frail, elderly HEENT:  EOMI, sclera anicteric Neck:     No masses; no thyromegaly Lungs:   Scattered expiratory wheeze CV:         Regular rate and rhythm; no murmurs Abd:      + bowel sounds; soft, non-tender; no palpable masses, no distension Ext:     No edema; adequate peripheral perfusion Skin:      Warm and dry; no rash Neuro: alert and oriented x 3 Psych: normal mood and affect  Resolved Hospital Problem list     Assessment & Plan:  Left hilar mass, hemoptysis The findings are concerning for malignancy. She will need a bronchoscopy for further evaluation. She is at increased risk of complication of bleeding, pneumothorax, respiratory failure with arrest given her severe COPD, age  I discussed the procedure, risks benefits in detail with patient, son and granddaughter.  They do want to go ahead with the bronchoscope at this point and are not even sure if she would want treatment with chemotherapy or radiation.  They are considering getting a second opinion or going with palliative care. PCCM will be available in case they change their mind.  Please call back with any questions.   Postobstructive pneumonia Continue levofloxacin  Severe COPD with mild exacerbation Prednisone 40 gm/day with taper over 7 to 10 days Continue dulera, start spiriva.  She can be discharged on her outpatient trelegy inhaler Duo nebs as needed  Disposition / Summary of Today's Plan 02/02/18   Family deferred bronchoscopy. Treat postobstructive pneumonia, COPD.  Labs   CBC: Recent Labs  Lab 02/01/18 1329 02/02/18 0526  WBC 5.2 5.0  HGB 10.9* 9.4*  HCT 34.9* 30.6*  MCV 84.9 86.9  PLT 301 291  Basic Metabolic Panel: Recent Labs  Lab 02/01/18 1329 02/02/18 0526  NA 139 140  K 4.0 4.2  CL 101 106  CO2 30 29  GLUCOSE 103* 112*  BUN 9 9  CREATININE 0.68 0.54  CALCIUM 9.4 8.9   GFR: Estimated Creatinine Clearance: 40.7 mL/min (by C-G formula based on SCr of 0.54 mg/dL). Recent Labs  Lab 02/01/18 1329 02/02/18 0526  WBC 5.2 5.0    Liver Function Tests: Recent Labs  Lab 02/01/18 1329  AST 26  ALT 20  ALKPHOS 94  BILITOT 0.4  PROT 8.3*  ALBUMIN 3.8   No results for input(s): LIPASE, AMYLASE in the last 168 hours. No  results for input(s): AMMONIA in the last 168 hours.  ABG No results found for: PHART, PCO2ART, PO2ART, HCO3, TCO2, ACIDBASEDEF, O2SAT   Coagulation Profile: Recent Labs  Lab 02/01/18 1329  INR 1.04    Cardiac Enzymes: Recent Labs  Lab 02/01/18 1329  TROPONINI <0.03    HbA1C: No results found for: HGBA1C  CBG: No results for input(s): GLUCAP in the last 168 hours.  Admitting History of Present Illness.   77 year old with severe COPD on 4 L oxygen, maintained on trelegy as an outpatient. Follows with Dr. Vonzella Nipple at [place]. Complains of 2 weeks of cough with congestion, dyspnea, wheeze.  She started having hemoptysis for the past 2 days. Noted to have left hilar mass with postobstructive pneumonia and admitted for further evaluation.  Review of Systems:    All negative; except for those that are bolded, which indicate positives.  Constitutional: weight loss, weight gain, night sweats, fevers, chills, fatigue, weakness.  HEENT: headaches, sore throat, sneezing, nasal congestion, post nasal drip, difficulty swallowing, tooth/dental problems, visual complaints, visual changes, ear aches. Neuro: difficulty with speech, weakness, numbness, ataxia. CV:  chest pain, orthopnea, PND, swelling in lower extremities, dizziness, palpitations, syncope.  Resp: cough, hemoptysis, dyspnea, wheezing. GI: heartburn, indigestion, abdominal pain, nausea, vomiting, diarrhea, constipation, change in bowel habits, loss of appetite, hematemesis, melena, hematochezia.  GU: dysuria, change in color of urine, urgency or frequency, flank pain, hematuria. MSK: joint pain or swelling, decreased range of motion. Psych: change in mood or affect, depression, anxiety, suicidal ideations, homicidal ideations. Skin: rash, itching, bruising.  Past Medical History  She,  has a past medical history of Asthma, COPD (chronic obstructive pulmonary disease) ([place]), Emphysema, and Hypertension.   Surgical History    History reviewed. No pertinent surgical history.   Social History   Social History   Socioeconomic History  . Marital status: Widowed    Spouse name: Not on file  . Number of children: Not on file  . Years of education: Not on file  . Highest education level: Not on file  Occupational History  . Not on file  Social Needs  . Financial resource strain: Not on file  . Food insecurity:    Worry: Not on file    Inability: Not on file  . Transportation needs:    Medical: Not on file    Non-medical: Not on file  Tobacco Use  . Smoking status: Former Research scientist (life sciences)  . Smokeless tobacco: Never Used  Substance and Sexual Activity  . Alcohol use: No  . Drug use: No  . Sexual activity: Not on file  Lifestyle  . Physical activity:    Days per week: Not on file    Minutes per session: Not on file  . Stress: Not on file  Relationships  . Social connections:  Talks on phone: Not on file    Gets together: Not on file    Attends religious service: Not on file    Active member of club or organization: Not on file    Attends meetings of clubs or organizations: Not on file    Relationship status: Not on file  . Intimate partner violence:    Fear of current or ex partner: Not on file    Emotionally abused: Not on file    Physically abused: Not on file    Forced sexual activity: Not on file  Other Topics Concern  . Not on file  Social History Narrative  . Not on file  ,  reports that she has quit smoking. She has never used smokeless tobacco. She reports that she does not drink alcohol or use drugs.   Family History   Her family history is not on file.   Allergies Allergies  Allergen Reactions  . Penicillins     Has patient had a PCN reaction causing immediate rash, facial/tongue/throat swelling, SOB or lightheadedness with hypotension: Y Has patient had a PCN reaction causing severe rash involving mucus membranes or skin necrosis: Y Has patient had a PCN reaction that required  hospitalization: N Has patient had a PCN reaction occurring within the last 10 years: N If all of the above answers are "NO", then may proceed with Cephalosporin use.      Home Medications  Prior to Admission medications   Medication Sig Start Date End Date Taking? Authorizing Provider  cyclobenzaprine (FLEXERIL) 5 MG tablet Take 5 mg by mouth 3 (three) times daily as needed for muscle spasms.    Yes [provider]  enalapril (VASOTEC) 10 MG tablet Take 10 mg by mouth daily.   Yes [provider]  Oxycodone HCl 10 MG TABS Take 10 mg by mouth every 4 (four) hours as needed (pain).  01/14/18  Yes [provider]  OXYGEN Inhale into the lungs.   Yes [provider]  TRELEGY ELLIPTA 100-62.5-25 MCG/INH AEPB Take 1 puff by mouth daily.  12/25/17  Yes [provider]  trimethoprim-polymyxin b (POLYTRIM) ophthalmic solution Place 1 drop into both eyes 4 (four) times daily as needed (itching).  01/12/18  Yes [provider]  VENTOLIN HFA 108 (90 Base) MCG/ACT inhaler Inhale 2 puffs into the lungs every 6 (six) hours as needed for wheezing or shortness of breath.  12/14/17  Yes [provider]   Marshell Garfinkel MD [facility] 02/02/2018, 11:08 AM

## 2018-02-02 NOTE — Evaluation (Signed)
Physical Therapy Evaluation Patient Details Name: Donna Cross MRN: [id number] DOB: 08-Dec-1940 Today's Date: 02/02/2018   History of Present Illness  Pt is a 77 year old female with PMHx significant for COPD on chronic oxygen and HTN and admitted with Left hilar mass, hemoptysis  Clinical Impression  Pt admitted with above diagnosis. Pt currently with functional limitations due to the deficits listed below (see PT Problem List).  Pt will benefit from skilled PT to increase their independence and safety with mobility to allow discharge to the venue listed below.   Pt ambulated in hallway and reports using 4L O2 [place] for daily activities at baseline.  Pt reports dyspnea with exertion and required a couple rest breaks for pursed lip breathing.  Pt would benefit from f/u HHPT to improve endurance.     Follow Up Recommendations Home health PT    Equipment Recommendations  None recommended by PT    Recommendations for Other Services       Precautions / Restrictions Precautions Precautions: Fall Precaution Comments: chronic oxygen      Mobility  Bed Mobility Overal bed mobility: Modified Independent                Transfers Overall transfer level: Needs assistance Equipment used: None Transfers: Sit to/from Stand Sit to Stand: Min guard         General transfer comment: min/guard for safety  Ambulation/Gait Ambulation/Gait assistance: Min guard Gait Distance (Feet): 180 Feet Assistive device: None Gait Pattern/deviations: Step-through pattern;Decreased stride length     General Gait Details: required 2 standing rest breaks, SpO2 98% on 4L O2 (states she keeps O2 on 4L with activity at home), moderate dyspnea  Stairs            Wheelchair Mobility    Modified Rankin (Stroke Patients Only)       Balance Overall balance assessment: No apparent balance deficits (not formally assessed)(denies any recent falls)                                            Pertinent Vitals/Pain Pain Assessment: No/denies pain    Home Living Family/patient expects to be discharged to:: Private residence Living Arrangements: Children;Other relatives Available Help at Discharge: Family;Available PRN/intermittently         Home Layout: One level Home Equipment: None      Prior Function Level of Independence: Independent               Hand Dominance        Extremity/Trunk Assessment        Lower Extremity Assessment Lower Extremity Assessment: Generalized weakness    Cervical / Trunk Assessment Cervical / Trunk Assessment: Normal  Communication   Communication: No difficulties  Cognition Arousal/Alertness: Awake/alert Behavior During Therapy: WFL for tasks assessed/performed Overall Cognitive Status: Within Functional Limits for tasks assessed                                        General Comments      Exercises     Assessment/Plan    PT Assessment Patient needs continued PT services  PT Problem List Decreased strength;Decreased mobility;Decreased knowledge of use of DME;Cardiopulmonary status limiting activity;Decreased activity tolerance       PT Treatment Interventions Gait training;Therapeutic  exercise;Patient/family education;DME instruction;Therapeutic activities;Functional mobility training;Balance training    PT Goals (Current goals can be found in the Care Plan section)  Acute Rehab PT Goals PT Goal Formulation: With patient/family Time For Goal Achievement: 02/09/18 Potential to Achieve Goals: Good    Frequency Min 3X/week   Barriers to discharge        Co-evaluation               AM-PAC PT "6 Clicks" Daily Activity  Outcome Measure Difficulty turning over in bed (including adjusting bedclothes, sheets and blankets)?: None Difficulty moving from lying on back to sitting on the side of the bed? : A Little Difficulty sitting down on and standing up from a chair  with arms (e.g., wheelchair, bedside commode, etc,.)?: A Little Help needed moving to and from a bed to chair (including a wheelchair)?: A Little Help needed walking in hospital room?: A Little Help needed climbing 3-5 steps with a railing? : A Little 6 Click Score: 19    End of Session Equipment Utilized During Treatment: Oxygen Activity Tolerance: Patient tolerated treatment well Patient left: in chair;with call bell/phone within reach;with family/visitor present;with chair alarm set Nurse Communication: Mobility status PT Visit Diagnosis: Difficulty in walking, not elsewhere classified (R26.2)    Time: 5072-2575 PT Time Calculation (min) (ACUTE ONLY): 12 min   Charges:   PT Evaluation $PT Eval Low Complexity: [place], PT, DPT Acute Rehabilitation Services Office: [phone] Pager: [phone]  York Ram E 02/02/2018, 1:15 PM

## 2018-02-02 NOTE — Care Management Note (Signed)
Case Management Note  Patient Details  Name: Donna Cross MRN: [id number] Date of Birth: 22-Jun-1940  Subjective/Objective:  Spoke with patient and granddaughter at bedside. Patient states that a nurse comes to see her once per month and she has PCS services 2x/wk from [facility]. She can not remember where the nurse is from, cant remember the name of the agency. She is agreeable to [facility] services, would like PT especially. She gets her O2 from [place].                      Action/Plan: Contacted [facility] for the referral, they have accepted. No DME needs. Awaiting final orders.   Expected Discharge Date:                  Expected Discharge Plan:  [place]  In-House Referral:  NA  Discharge planning Services  CM Consult  Post Acute Care Choice:  Home Health Choice offered to:  Patient, Adult Children  DME Arranged:  N/A DME Agency:  NA  HH Arranged:  PT, RN, Disease Management [place] Agency:  [facility]  Status of Service:  Completed, signed off  If discussed at [place] of Stay Meetings, dates discussed:    Additional Comments:  Guadalupe Maple, RN 02/02/2018, 11:54 AM

## 2018-02-03 MED ORDER — ENSURE ENLIVE PO LIQD
237.0000 mL | ORAL | Status: DC
  Administered 2018-02-03: 237 mL via ORAL

## 2018-02-03 NOTE — Progress Notes (Signed)
Nutrition Follow-up  DOCUMENTATION CODES:   Not applicable  INTERVENTION:  - Will order Ensure Enlive once/day, this supplement provides 350 kcal and 20 grams of protein. - Continue to encourage PO intakes.    NUTRITION DIAGNOSIS:   Increased nutrient needs related to acute illness as evidenced by estimated needs.  GOAL:   Patient will meet greater than or equal to 90% of their needs  MONITOR:   PO intake, Supplement acceptance, Weight trends, Labs  REASON FOR ASSESSMENT:   Malnutrition Screening Tool  ASSESSMENT:   77 y.o. year old female with medical history significant for COPD, 40 pack year smoking history ( quit 20 years ago),  HTN, HLD who presented on 02/01/2018 with weeks of progressively worsening dyspnea and new onset hemoptysis and was found to have lung mass concerning for primary lung malignancy and COPD exacerbation complicated by postobstructive pneumonia.  BMI indicates normal weight. Patient was NPO until diet advanced yesterday at 11:30 AM. She was able to consume 100% of lunch and dinner (total of 780 kcal, 45 grams of protein) yesterday. Patient denies issues with chewing or swallowing. Some slight increase in SOB with PO intakes. No abdominal pain or nausea with or without eating.   NFPE shows no muscle or fat wasting at this time. Per chart review, current weight is 106 lb and weight on 8/14 was 107 lb.   Per Dr. Lisbeth Ply note yesterday: COPD with exacerbation, wheezing likely d/t post-obstructive PNA, LLL mass which is concerning for primary bronchogenic carcinoma. Note states that family is discussing whether or not to pursue treatment for possible malignancy versus palliative care.   Medications reviewed; 40 mg Deltasone/day. Labs reviewed. IVF; NS @ 75 mL/hr.       NUTRITION - FOCUSED PHYSICAL EXAM:  Completed; no muscle and no fat wasting.   Diet Order:   Diet Order            Diet regular Room service appropriate? Yes; Fluid consistency:  Thin  Diet effective now              EDUCATION NEEDS:   No education needs have been identified at this time  Skin:  Skin Assessment: Reviewed RN Assessment  Last BM:  10/20  Height:   Ht Readings from Last 1 Encounters:  02/01/18 4' 9.5" (1.461 m)    Weight:   Wt Readings from Last 1 Encounters:  02/01/18 48.1 kg    Ideal Body Weight:  41.82 kg  BMI:  Body mass index is 22.54 kg/m.  Estimated Nutritional Needs:   Kcal:  0347-4259  Protein:  65-75 grams  Fluid:  >/= 1.5 L/day     Jarome Matin, MS, RD, LDN, [facility] Inpatient Clinical Dietitian Pager # [phone] After hours/weekend pager # [phone]

## 2018-02-03 NOTE — Progress Notes (Signed)
PROGRESS NOTE  Donna Cross IOX:[id number] DOB: Jun 12, 1940 DOA: 02/01/2018 PCP: System, Pcp Not In  HPI/Brief Narrative  Donna Cross is a 77 y.o. year old female with medical history significant for COPD, 40 pack year smoking history ( quit 20 years ago),  HTN, HLD who presented on 02/01/2018 with weeks of progressively worsening dyspnea and new onset hemoptysis and was found to have lung mass concerning for primary lung malignancy and COPD exacerbation complicated by postobstructive pneumonia.  Subjective Not coughing up blood today. But feels more dyspneic. Would like to have goals of care discussion.   Assessment/Plan:  #COPD with acute exacerbation, mild.  With some intermittent wheezing likely worsened by postobstructive pneumonia.  Still on home oxygen requirements but persistently wheezing and short of breath with minimal activity.  Continue prednisone, Spiriva added by pulm, nebs as needed.  #Post obstruction pneumonia, stable.  Stable oxygen requirement.  Switched to oral Levaquin from IV on 10/21. if cough becomes productive will consider obtaining sputum culture  #Left upper lobe mass with invasion of left hilum, worrisome for primary bronchogenic carcinoma.  Demonstrated chest x-ray and confirmed on CT chest.  Pulmonary consulted, discussion with patient and family on 10/21. Patient declines biopsy. Would like to discuss with palliative care team. Palliative consulted  #Hemoptysis.  Likely secondary to mass as mentioned above.  Very concerning for primary lung malignancy.  Hemoglobin currently stable with no other signs or symptoms of bleeding.  #Chronic hypoxic respiratory failure secondary to COPD.  Home O2 requirements of 3 L, currently maintained on that.  We will continue to monitor  Hypertension, controlled.  Continue home enalapril.  Code Status: Full Code   Family Communication: no family at bedside  Disposition Plan: more dyspneic today but no increase in oxygen  requirements. Palliative consulted and monitor to ensure improvement in symptoms before likely discharge in 24 hours     Consultants:  Pulm   Palliative  Procedures:  none   Antimicrobials: Anti-infectives (From admission, onward)   Start     Dose/Rate Route Frequency Ordered Stop   02/03/18 1000  levofloxacin (LEVAQUIN) tablet 750 mg     750 mg Oral Every other day 02/02/18 1350     02/02/18 1600  levofloxacin (LEVAQUIN) IVPB 750 mg  Status:  Discontinued     750 mg 100 mL/hr over 90 Minutes Intravenous Every 48 hours 02/01/18 2050 02/02/18 1350   02/01/18 2100  levofloxacin (LEVAQUIN) IVPB 750 mg  Status:  Discontinued     750 mg 100 mL/hr over 90 Minutes Intravenous Every 48 hours 02/01/18 2049 02/01/18 2050   02/01/18 2045  levofloxacin (LEVAQUIN) IVPB 500 mg  Status:  Discontinued     500 mg 100 mL/hr over 60 Minutes Intravenous Every 24 hours 02/01/18 2034 02/01/18 2048   02/01/18 1630  levofloxacin (LEVAQUIN) IVPB 500 mg     500 mg 100 mL/hr over 60 Minutes Intravenous  Once 02/01/18 1627 02/01/18 1747        Cultures:  none  Telemetry:non  DVT prophylaxis:  lovenox   Objective: Vitals:   02/02/18 2106 02/03/18 0629 02/03/18 0759 02/03/18 1424  BP: (!) 139/59 (!) 142/61  (!) 151/61  Pulse: 65 (!) 57  76  Resp: 17 16  18   Temp: 98.1 F (36.7 C) 98 F (36.7 C)  98.5 F (36.9 C)  TempSrc: Oral Oral  Oral  SpO2: 100% 100% 100% 100%  Weight:      Height:  Intake/Output Summary (Last 24 hours) at 02/03/2018 1502 Last data filed at 02/03/2018 1422 Gross per 24 hour  Intake 1419.02 ml  Output 1551 ml  Net -131.98 ml   Filed Weights   02/01/18 1322  Weight: 48.1 kg    Exam:  Constitutional:elderly female, no distress Eyes: EOMI, anicteric, normal conjunctivae ENMT: Oropharynx with moist mucous membranes, normal dentition Cardiovascular: RRR no MRGs, with no peripheral edema Respiratory: Normal respiratory effort on 3 L [place], diminished  breath sounds throughout, end-expiratory wheezing Abdomen: Soft,non-tender,  Skin: No rash ulcers, or lesions. Without skin tenting  Neurologic: Grossly no focal neuro deficit. Psychiatric:Appropriate affect, and mood. Mental status AAOx3  Data Reviewed: CBC: Recent Labs  Lab 02/01/18 1329 02/02/18 0526  WBC 5.2 5.0  HGB 10.9* 9.4*  HCT 34.9* 30.6*  MCV 84.9 86.9  PLT 301 482   Basic Metabolic Panel: Recent Labs  Lab 02/01/18 1329 02/02/18 0526  NA 139 140  K 4.0 4.2  CL 101 106  CO2 30 29  GLUCOSE 103* 112*  BUN 9 9  CREATININE 0.68 0.54  CALCIUM 9.4 8.9   GFR: Estimated Creatinine Clearance: 40.7 mL/min (by C-G formula based on SCr of 0.54 mg/dL). Liver Function Tests: Recent Labs  Lab 02/01/18 1329  AST 26  ALT 20  ALKPHOS 94  BILITOT 0.4  PROT 8.3*  ALBUMIN 3.8   No results for input(s): LIPASE, AMYLASE in the last 168 hours. No results for input(s): AMMONIA in the last 168 hours. Coagulation Profile: Recent Labs  Lab 02/01/18 1329  INR 1.04   Cardiac Enzymes: Recent Labs  Lab 02/01/18 1329  TROPONINI <0.03   BNP (last 3 results) No results for input(s): PROBNP in the last 8760 hours. HbA1C: No results for input(s): HGBA1C in the last 72 hours. CBG: No results for input(s): GLUCAP in the last 168 hours. Lipid Profile: No results for input(s): CHOL, HDL, LDLCALC, TRIG, CHOLHDL, LDLDIRECT in the last 72 hours. Thyroid Function Tests: No results for input(s): TSH, T4TOTAL, FREET4, T3FREE, THYROIDAB in the last 72 hours. Anemia Panel: No results for input(s): VITAMINB12, FOLATE, FERRITIN, TIBC, IRON, RETICCTPCT in the last 72 hours. Urine analysis: No results found for: COLORURINE, APPEARANCEUR, LABSPEC, PHURINE, GLUCOSEU, HGBUR, BILIRUBINUR, KETONESUR, PROTEINUR, UROBILINOGEN, NITRITE, LEUKOCYTESUR Sepsis Labs: @LABRCNTIP (procalcitonin:4,lacticidven:4)  )No results found for this or any previous visit (from the past 240 hour(s)).     Studies: No results found.  Scheduled Meds: . amLODipine  10 mg Oral Daily  . diclofenac sodium  2 g Topical QID  . enalapril  10 mg Oral Daily  . enoxaparin (LOVENOX) injection  30 mg Subcutaneous Q24H  . feeding supplement (ENSURE ENLIVE)  237 mL Oral Q24H  . levofloxacin  750 mg Oral QODAY  . mouth rinse  15 mL Mouth Rinse BID  . mometasone-formoterol  2 puff Inhalation BID  . predniSONE  40 mg Oral Q breakfast  . umeclidinium bromide  1 puff Inhalation Daily    Continuous Infusions: . sodium chloride 75 mL/hr at 02/03/18 0208     LOS: 2 days     Desiree Hane, MD [facility] Pager [phone]  If 7PM-7AM, please contact night-coverage [email or web address] Password [email or web address] 02/03/2018, 3:02 PM

## 2018-02-04 LAB — BASIC METABOLIC PANEL
Anion gap: 6 (ref 5–15)
BUN: 18 mg/dL (ref 8–23)
CO2: 31 mmol/L (ref 22–32)
Calcium: 8.7 mg/dL — ABNORMAL LOW (ref 8.9–10.3)
Chloride: 104 mmol/L (ref 98–111)
Creatinine, Ser: 0.6 mg/dL (ref 0.44–1.00)
GFR calc Af Amer: >60 mL/min (ref >60)
GFR calc non Af Amer: >60 mL/min (ref >60)
Glucose, Bld: 93 mg/dL (ref 70–99)
Potassium: 3.8 mmol/L (ref 3.5–5.1)
Sodium: 141 mmol/L (ref 135–145)

## 2018-02-04 LAB — CBC
HCT: 32.3 % — ABNORMAL LOW (ref 36.0–46.0)
Hemoglobin: 9.8 g/dL — ABNORMAL LOW (ref 12.0–15.0)
MCH: 26.4 pg (ref 26.0–34.0)
MCHC: 30.3 g/dL (ref 30.0–36.0)
MCV: 87.1 fL (ref 80.0–100.0)
Platelets: 318 10*3/uL (ref 150–400)
RBC: 3.71 MIL/uL — ABNORMAL LOW (ref 3.87–5.11)
RDW: 13.5 % (ref 11.5–15.5)
WBC: 8.4 10*3/uL (ref 4.0–10.5)
nRBC: 0 % (ref 0.0–0.2)

## 2018-02-04 MED ORDER — PREDNISONE 20 MG PO TABS: 40.0000 mg | ORAL_TABLET | Freq: Every day | ORAL | 0 refills | Status: AC

## 2018-02-04 MED ORDER — LEVOFLOXACIN 750 MG PO TABS: 750.0000 mg | ORAL_TABLET | ORAL | 0 refills | Status: AC

## 2018-02-04 MED ORDER — INFLUENZA VAC SPLIT HIGH-DOSE 0.5 ML IM SUSY: 0.5000 mL | PREFILLED_SYRINGE | INTRAMUSCULAR | Status: DC

## 2018-02-04 NOTE — Progress Notes (Signed)
   02/04/18 1500  Clinical Encounter Type  Visited With Patient  Visit Type Initial  Referral From Palliative care team  Consult/Referral To Chaplain  The chaplain was welcomed into the Pt. room after a brief introduction of the chaplain's role.  The Pt. main topic of discussion and distress was why her "specialist" did not see the mass during the time she was a patient.  The Pt. shared with the chaplain at this time she will not have a biopsy.  The Pt. plans to hear the medical diagnosises and make her upcoming medical decisions with the focus that God will determine her end of life. The Pt. added she has shared her decision with her family, with most of her family members accepting her decision.  At the end of our visit the Pt. and chaplain prayed for healing.

## 2018-02-04 NOTE — Discharge Summary (Signed)
Physician Discharge Summary  Donna Cross BPZ:[id number] DOB: [birth year] DOA: 02/01/2018  PCP: System, Pcp Not In  Admit date: 02/01/2018 Discharge date: 02/04/2018  Admitted From: Home Disposition:  Home  Recommendations for Outpatient Follow-up:  1. Follow up with PCP in 1-2 weeks 2. Please obtain BMP/CBC in one week 3. Please follow up with the lung doctors and the oncologist if you have decided to change her mind. 4. Please follow-up with outpatient palliative care.   Home Health: Home health PT Equipment/Devices: 3 L nasal cannula  Discharge Condition: Stable CODE STATUS: Full Diet recommendation: Regular  Brief/Interim Summary:  #) Hemoptysis in the setting of new lung malignancy: Patient was admitted with hemoptysis and imaging findings concerning for likely primary bronchogenic carcinoma.  Patient opted not to have any further treatment done when discussion of chemotherapy and additional diagnosis were done.  Patient is not interested in having a bronchoscopy with biopsy at this time.  She will follow-up as an outpatient with both palliative care and pulmonary if she changes her mind.  #) COPD exacerbation: Patient was noted to have mild COPD exacerbation.  There is also component of post infective pneumonia noted on her imaging.  She was started on oral levofloxacin and oral steroids with resolution of her symptoms.  She was discharged to complete a course of oral levofloxacin and steroids for her COPD exacerbation.  She was continued on her home LIMA/LABA/ICS.  #) Hypertension: Patient was continued on home enalapril.  Discharge Diagnoses:  Active Problems:   Lung mass   Hemoptysis   Postobstructive pneumonia   COPD with acute exacerbation [facility])    Discharge Instructions  Discharge Instructions    Call MD for:  difficulty breathing, headache or visual disturbances   Complete by:  As directed    Call MD for:  extreme fatigue   Complete by:  As directed    Call  MD for:  hives   Complete by:  As directed    Call MD for:  persistant dizziness or light-headedness   Complete by:  As directed    Call MD for:  persistant nausea and vomiting   Complete by:  As directed    Call MD for:  redness, tenderness, or signs of infection (pain, swelling, redness, odor or green/yellow discharge around incision site)   Complete by:  As directed    Call MD for:  severe uncontrolled pain   Complete by:  As directed    Call MD for:  temperature >100.4   Complete by:  As directed    Diet - low sodium heart healthy   Complete by:  As directed    Discharge instructions   Complete by:  As directed    Please follow-up with your primary care doctor in 1 week.  Please follow-up with the oncologist as an outpatient in the lung doctor as outpatient if you change your mind about your diagnosis.   Increase activity slowly   Complete by:  As directed      Allergies as of 02/04/2018      Reactions   Penicillins    Has patient had a PCN reaction causing immediate rash, facial/tongue/throat swelling, SOB or lightheadedness with hypotension: Y Has patient had a PCN reaction causing severe rash involving mucus membranes or skin necrosis: Y Has patient had a PCN reaction that required hospitalization: N Has patient had a PCN reaction occurring within the last 10 years: N If all of the above answers are "NO", then may proceed  with Cephalosporin use.      Medication List    TAKE these medications   cyclobenzaprine 5 MG tablet Commonly known as:  FLEXERIL Take 5 mg by mouth 3 (three) times daily as needed for muscle spasms.   enalapril 10 MG tablet Commonly known as:  VASOTEC Take 10 mg by mouth daily.   levofloxacin 750 MG tablet Commonly known as:  LEVAQUIN Take 1 tablet (750 mg total) by mouth every other day for 4 days. Start taking on:  02/05/2018   Oxycodone HCl 10 MG Tabs Take 10 mg by mouth every 4 (four) hours as needed (pain).   OXYGEN Inhale into the  lungs.   predniSONE 20 MG tablet Commonly known as:  DELTASONE Take 2 tablets (40 mg total) by mouth daily with breakfast for 3 days. Start taking on:  02/05/2018   TRELEGY ELLIPTA 100-62.5-25 MCG/INH Aepb Generic drug:  Fluticasone-Umeclidin-Vilant Take 1 puff by mouth daily.   trimethoprim-polymyxin b ophthalmic solution Commonly known as:  POLYTRIM Place 1 drop into both eyes 4 (four) times daily as needed (itching).   VENTOLIN HFA 108 (90 Base) MCG/ACT inhaler Generic drug:  albuterol Inhale 2 puffs into the lungs every 6 (six) hours as needed for wheezing or shortness of breath.      Follow-up Information    [facility] Follow [facility].   Specialty:  Home Health Services Why:  nurse and physical therapy Contact information: [place] [address] [phone]          Allergies  Allergen Reactions  . Penicillins     Has patient had a PCN reaction causing immediate rash, facial/tongue/throat swelling, SOB or lightheadedness with hypotension: Y Has patient had a PCN reaction causing severe rash involving mucus membranes or skin necrosis: Y Has patient had a PCN reaction that required hospitalization: N Has patient had a PCN reaction occurring within the last 10 years: N If all of the above answers are "NO", then may proceed with Cephalosporin use.     Consultations:  Oncology   Procedures/Studies: Dg Chest 2 View  Result Date: 02/01/2018 CLINICAL DATA:  Shortness breath.  Hematochezia.  Cough. EXAM: CHEST - 2 VIEW COMPARISON:  CT of the chest 06/02/2016 FINDINGS: Heart size is normal. Aortic atherosclerosis is present. Left perihilar mass. A left perihilar mass measures up to 4.3 cm. Ill-defined airspace disease lateral to this area may reflect postobstructive pneumonitis. No other discrete airspace disease is present. The lungs are hyperinflated. Visualized soft tissues and bony thorax are unremarkable. IMPRESSION: 1. Left  perihilar mass lesion concerning for neoplasm. Recommend CT of the chest with contrast. 2. Airspace disease lateral to the mass lesion may reflect post obstructive disease. 3. Aortic atherosclerosis. These results were called by telephone at the time of interpretation on 02/01/2018 at 2:10 pm to Dr. Dorie Rank , who verbally acknowledged these results. Electronically Signed   By: San Morelle M.D.   On: 02/01/2018 14:12   Ct Chest W Contrast  Result Date: 02/01/2018 CLINICAL DATA:  Evaluate lung mass. EXAM: CT CHEST WITH CONTRAST TECHNIQUE: Multidetector CT imaging of the chest was performed during intravenous contrast administration. CONTRAST:  39mL ISOVUE-300 IOPAMIDOL (ISOVUE-300) INJECTION 61% COMPARISON:  06/02/2016 and 04/01/2014 FINDINGS: Cardiovascular: Heart size appears normal. No pericardial effusion. Aortic atherosclerosis. Calcification in the LAD, left circumflex and RCA coronary artery noted. Mediastinum/Nodes: Normal appearance of the thyroid gland. The trachea appears patent and is midline. Normal appearance of the esophagus. Left paratracheal lymph  node measures 8 mm, image 66/2. Previously 6 mm. No right paratracheal or subcarinal adenopathy. No axillary or supraclavicular adenopathy. Lungs/Pleura: Moderate to advanced changes of emphysema. Left upper lobe lung mass with left hilar invasion is identified. This measures 4.1 by 3.5 by 4.2 cm. There is encasement and narrowing of the left upper lobe and lingular bronchi. The mass involves the oblique fissure and extends into the left lower lobe, image 81/3. Postobstructive pneumonitis within the posterior and lateral left upper lobe is identified. Left upper lobe pulmonary nodule measures 3 mm, image 50/3. Unchanged when compared with 06/02/2016. Right middle lobe pulmonary nodule measures 0.9 cm, image 107/3 and is unchanged from 04/01/2014. Upper Abdomen: No acute abnormality. Musculoskeletal: No chest wall abnormality. No acute or  significant osseous findings. IMPRESSION: 1. Within the central left upper lobe there is a mass measuring 4.1 cm which invades the left hilum. There is involvement of the oblique fissure and left lower lobe as well as postobstructive pneumonitis within the posterior and lateral left upper lobe. Highly worrisome for primary bronchogenic carcinoma. Further evaluation with PET-CT is advised. 2. Pulmonary nodule within the right middle lobe is unchanged measuring 9 mm. This is been present since 04/01/2014 and likely represents a benign process. 3. Aortic Atherosclerosis (ICD10-I70.0) and Emphysema (ICD10-J43.9). 4. Multi vessel coronary artery atherosclerotic calcifications. Electronically Signed   By: Kerby Moors M.D.   On: 02/01/2018 16:06      Subjective:   Discharge Exam: Vitals:   02/04/18 1116 02/04/18 1406  BP:  (!) 141/58  Pulse:  68  Resp:  20  Temp:  98.4 F (36.9 C)  SpO2: 98% 100%   Vitals:   02/04/18 0820 02/04/18 0821 02/04/18 1116 02/04/18 1406  BP: (!) 152/63   (!) 141/58  Pulse: 60   68  Resp: 18   20  Temp: 97.7 F (36.5 C)   98.4 F (36.9 C)  TempSrc: Oral   Oral  SpO2: 100% 100% 98% 100%  Weight:      Height:        General: Pt is alert, awake, not in acute distress Cardiovascular: Regular rate and rhythm, no murmurs Respiratory: No increased work of breathing, no wheezes, rhonchi, rales Abdominal: Soft, NT, ND, bowel sounds + Extremities: no edema    The results of significant diagnostics from this hospitalization (including imaging, microbiology, ancillary and laboratory) are listed below for reference.     Microbiology: No results found for this or any previous visit (from the past 240 hour(s)).   Labs: BNP (last 3 results) Recent Labs    02/01/18 1329  BNP 00.9   Basic Metabolic Panel: Recent Labs  Lab 02/01/18 1329 02/02/18 0526 02/04/18 0558  NA 139 140 141  K 4.0 4.2 3.8  CL 101 106 104  CO2 30 29 31   GLUCOSE 103* 112* 93  BUN  9 9 18   CREATININE 0.68 0.54 0.60  CALCIUM 9.4 8.9 8.7*   Liver Function Tests: Recent Labs  Lab 02/01/18 1329  AST 26  ALT 20  ALKPHOS 94  BILITOT 0.4  PROT 8.3*  ALBUMIN 3.8   No results for input(s): LIPASE, AMYLASE in the last 168 hours. No results for input(s): AMMONIA in the last 168 hours. CBC: Recent Labs  Lab 02/01/18 1329 02/02/18 0526 02/04/18 0558  WBC 5.2 5.0 8.4  HGB 10.9* 9.4* 9.8*  HCT 34.9* 30.6* 32.3*  MCV 84.9 86.9 87.1  PLT 301 291 318   Cardiac Enzymes: Recent Labs  Lab 02/01/18 1329  TROPONINI <0.03   BNP: Invalid input(s): POCBNP CBG: No results for input(s): GLUCAP in the last 168 hours. D-Dimer No results for input(s): DDIMER in the last 72 hours. Hgb A1c No results for input(s): HGBA1C in the last 72 hours. Lipid Profile No results for input(s): CHOL, HDL, LDLCALC, TRIG, CHOLHDL, LDLDIRECT in the last 72 hours. Thyroid function studies No results for input(s): TSH, T4TOTAL, T3FREE, THYROIDAB in the last 72 hours.  Invalid input(s): FREET3 Anemia work up No results for input(s): VITAMINB12, FOLATE, FERRITIN, TIBC, IRON, RETICCTPCT in the last 72 hours. Urinalysis No results found for: COLORURINE, APPEARANCEUR, LABSPEC, [place], GLUCOSEU, HGBUR, BILIRUBINUR, KETONESUR, PROTEINUR, UROBILINOGEN, NITRITE, LEUKOCYTESUR Sepsis Labs Invalid input(s): PROCALCITONIN,  WBC,  LACTICIDVEN Microbiology No results found for this or any previous visit (from the past 240 hour(s)).   Time coordinating discharge: 35  SIGNED:   Cristy Folks, MD  [facility] 02/04/2018, 3:10 PM  If 7PM-7AM, please contact night-coverage [email or web address] Password [email or web address]

## 2018-02-04 NOTE — Discharge Instructions (Signed)

## 2018-02-04 NOTE — Clinical Social Work Note (Signed)
Clinical Social Work Assessment  Patient Details  Name: Donna Cross MRN: [id number] Date of Birth: 28-Nov-1940  Date of referral:  02/04/18               Reason for consult:  Facility Placement, Discharge Planning                Permission sought to share information with:  Family Supports Permission granted to share information::  Yes, Verbal Permission Granted  Name::     Baltazar Apo  Agency::     Relationship::  Granddaughter   Contact Information:  [phone]  Housing/Transportation Living arrangements for the past 2 months:  Petal of Information:  Patient, Other (Comment Required)(granddaughter) Patient Interpreter Needed:  None Criminal Activity/Legal Involvement Pertinent to Current Situation/Hospitalization:  No - Comment as needed Significant Relationships:  Other Family Members Lives with:  Other (Comment)(grandson) Do you feel safe going back to the place where you live?  Yes Need for family participation in patient care:  Yes (Comment)  Care giving concerns:  Patient from home with grandson. Patient reported that prior to hospitalization she was independent with ambulation and ADLs. Patient "with PMHx significant for COPD on chronic oxygen and HTN and admitted with Left hilar mass, hemoptysis". PT recommending home health PT. CSW consulted for SNF placement.   Social Worker assessment / plan:  CSW spoke with patient at bedside regarding discharge planning and consult for SNF placement. Patient reported that her granddaughter wanted to speak with CSW and that she was not interested in SNF placement. Patient reported that she was more concerned about her house (old carpet and vent issues). CSW inquired if patient spoke with her landlord regarding concerns with home, patient reported no and that they wanted to speak with CSW to see if there was anything that CSW could do. Patient reported that [place] is [place] [address]. CSW explained to patient that  she can look for new housing, patient replied okay. Patient granted CSW verbal permission to speak with her granddaughter to discuss discharge planning further. CSW explained that PT recommended HHPT, patient agreeable to home health services.   CSW contacted patient's granddaughter and discussed patient's discharge plans. Patient's granddaughter reported that she resides in [place] and is trying to coordinate patient's care needs while she is in town. Patient's granddaughter inquired about senior housing options, CSW provided senior housing resources. Patient's granddaughter asked for resources for hospice and palliative care services, CSW provided resources. Patient's granddaughter agreeable to patient discharging home with home health services.  Patient discharging home, CSW provided all requested resources. Patient's RNCM following for home health needs, CSW signing off no other needs identified at this time.  Employment status:  Retired Nurse, adult PT Recommendations:  Home with [place] / Referral to community resources:  Other (Comment Required)(Referral to [facility] for [facility] health services)  Patient/Family's Response to care:  Patient/patient's granddaughter appreciative of CSW providing resources.  Patient/Family's Understanding of and Emotional Response to Diagnosis, Current Treatment, and Prognosis:  Patient presented calm and verbalized understanding PT recommendation for home health. Patient's granddaughter involved in patient's care and verbalized concern about patient's housing, CSW validated patient's granddaughter's feelings and provided resources as requested.   Emotional Assessment Appearance:  Appears stated age Attitude/Demeanor/Rapport:  Engaged Affect (typically observed):  Appropriate, Calm Orientation:  Oriented to Self, Oriented to Situation, Oriented to Place, Oriented to  Time Alcohol / Substance use:  Not Applicable Psych  involvement (Current  and /or in the community):  No (Comment)  Discharge Needs  Concerns to be addressed:  Care Coordination Readmission within the last 30 days:  No Current discharge risk:  None Barriers to Discharge:  No Barriers Identified   Burnis Medin, LCSW 02/04/2018, 4:00 PM

## 2018-02-04 NOTE — Care Management Important Message (Signed)
Important Message  Patient Details  Name: Donna Cross MRN: [id number] Date of Birth: 1940/04/18   Medicare Important Message Given:  Yes    Kerin Salen 02/04/2018, 11:17 AMImportant Message  Patient Details  Name: Donna Cross MRN: [id number] Date of Birth: Aug 03, 1940   Medicare Important Message Given:  Yes    Kerin Salen 02/04/2018, 11:17 AM

## 2018-06-22 DIAGNOSIS — R918 Other nonspecific abnormal finding of lung field: Secondary | ICD-10-CM

## 2018-07-03 DIAGNOSIS — C3492 Malignant neoplasm of unspecified part of left bronchus or lung: Secondary | ICD-10-CM | POA: Diagnosis not present

## 2018-09-03 DIAGNOSIS — C3492 Malignant neoplasm of unspecified part of left bronchus or lung: Secondary | ICD-10-CM | POA: Diagnosis not present

## 2018-10-28 DIAGNOSIS — C3482 Malignant neoplasm of overlapping sites of left bronchus and lung: Secondary | ICD-10-CM

## 2018-11-02 IMAGING — DX DG CHEST 2V
2 series · 2 of 2 positions shown · non-contrast
Comparison: CT of the chest 06/02/2016

CLINICAL DATA: Shortness breath.  Hematochezia.  Cough.

EXAM:
CHEST - 2 VIEW

[chest pa]
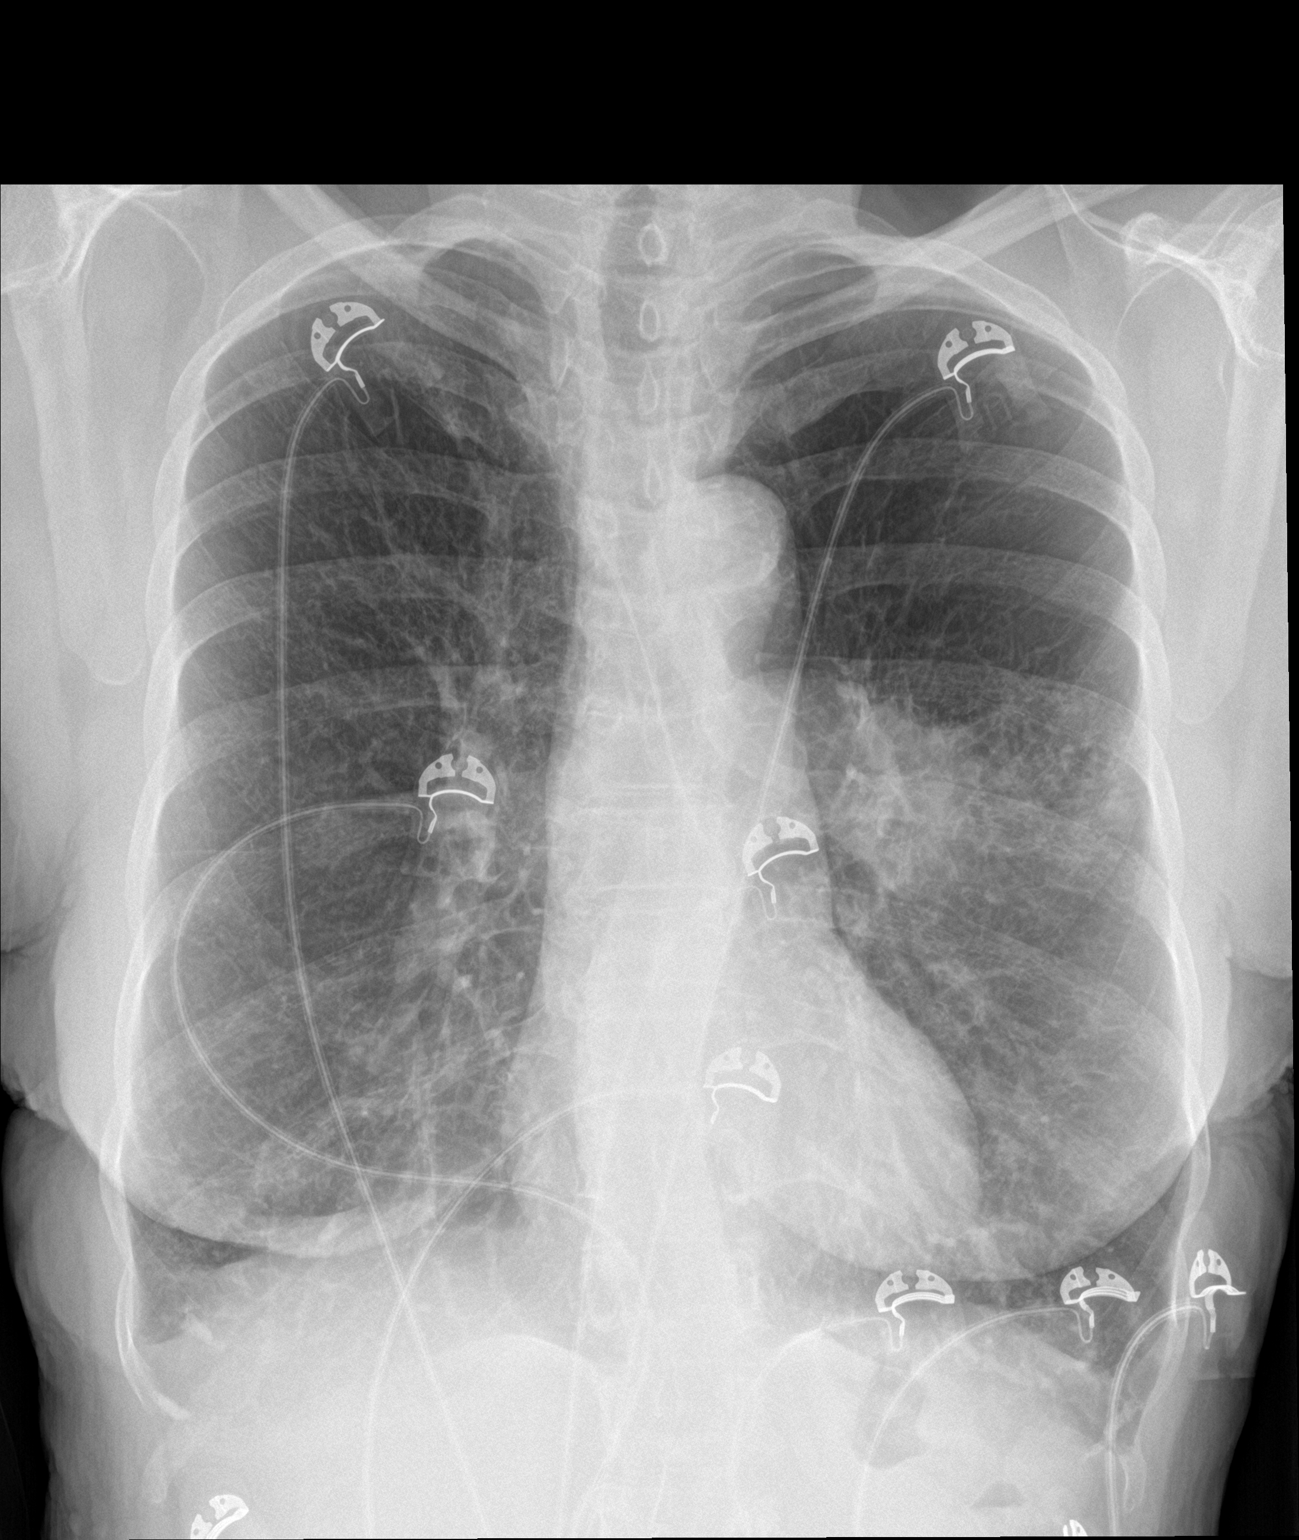

[chest lat]
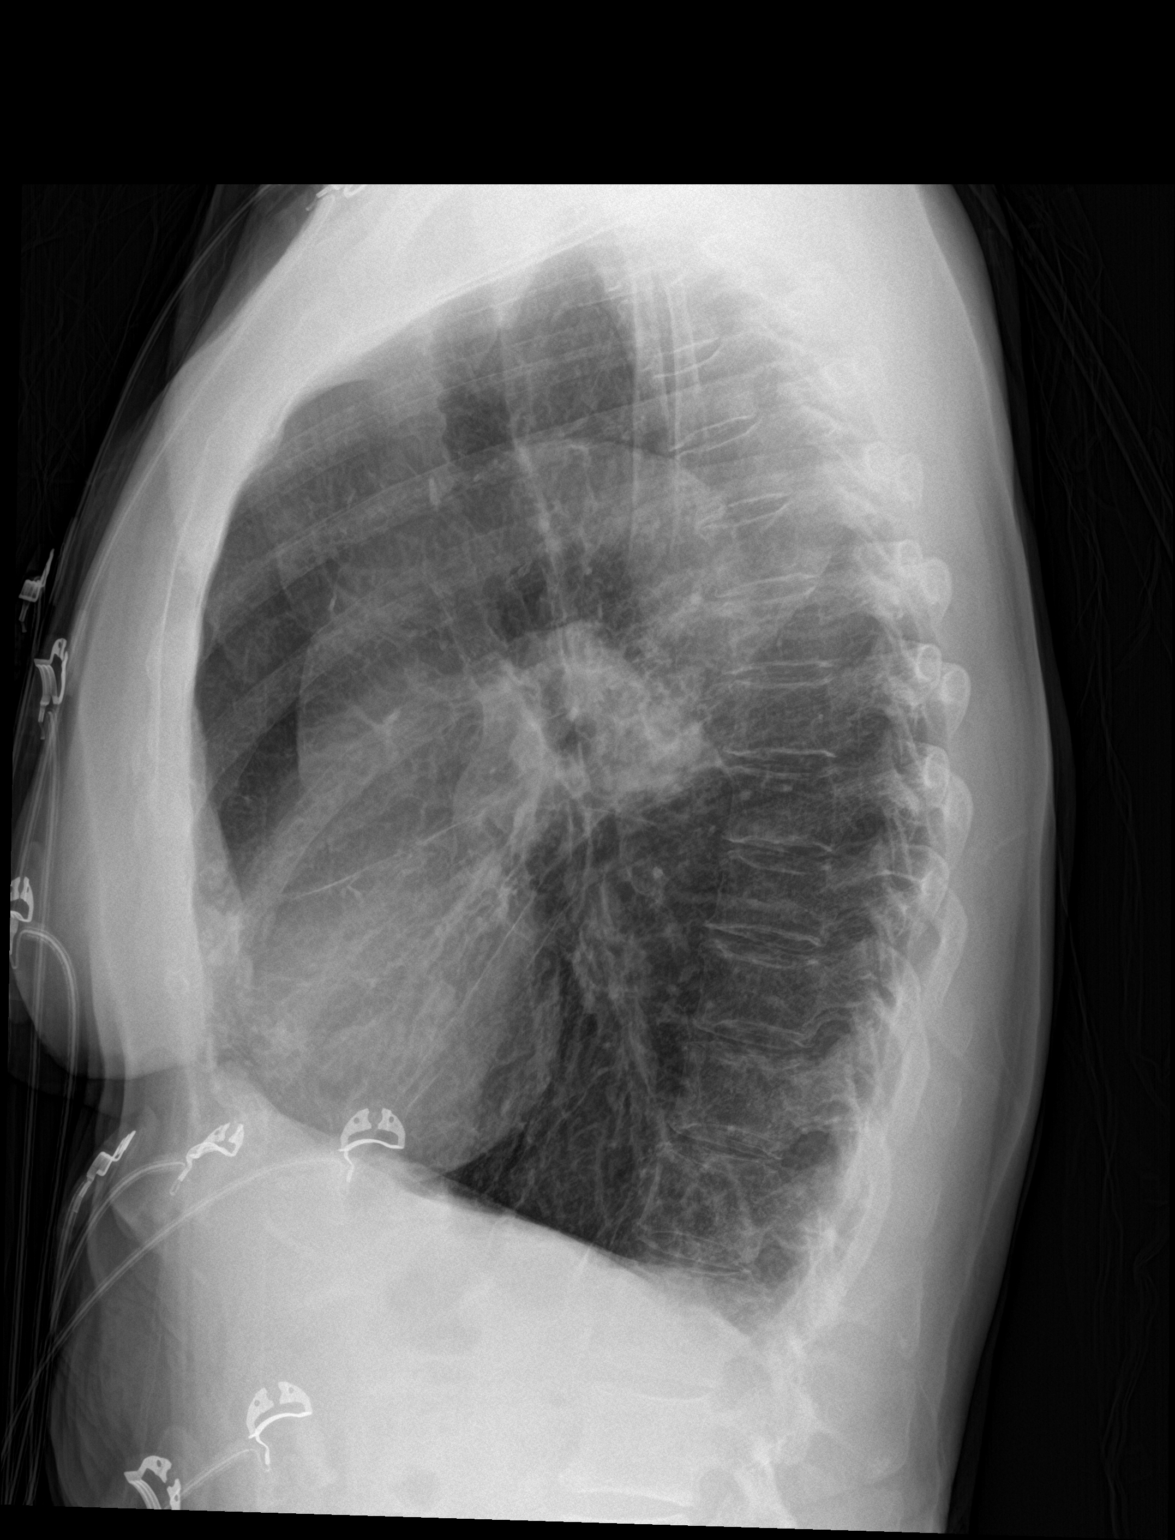

[2 of 2 positions shown; findings below may reference images not displayed]

FINDINGS: Heart size is normal. Aortic atherosclerosis is present. Left
perihilar mass. A left perihilar mass measures up to 4.3 cm.
Ill-defined airspace disease lateral to this area may reflect
postobstructive pneumonitis. No other discrete airspace disease is
present. The lungs are hyperinflated. Visualized soft tissues and
bony thorax are unremarkable.
IMPRESSION: 1. Left perihilar mass lesion concerning for neoplasm. Recommend CT
of the chest with contrast.
2. Airspace disease lateral to the mass lesion may reflect post
obstructive disease.
3. Aortic atherosclerosis.

These results were called by telephone at the time of interpretation
on 02/01/2018 at [DATE] to Dr. DARLYN CONGO KEVINJOSE , who verbally
acknowledged these results.

## 2018-12-08 DIAGNOSIS — C3482 Malignant neoplasm of overlapping sites of left bronchus and lung: Secondary | ICD-10-CM

## 2019-04-20 DIAGNOSIS — C3482 Malignant neoplasm of overlapping sites of left bronchus and lung: Secondary | ICD-10-CM

## 2019-05-04 DIAGNOSIS — C3482 Malignant neoplasm of overlapping sites of left bronchus and lung: Secondary | ICD-10-CM

## 2019-05-06 ENCOUNTER — Other Ambulatory Visit: Payer: Self-pay | Admitting: *Deleted

## 2019-05-06 NOTE — Progress Notes (Signed)
The proposed treatment discussed in cancer conference 05/06/19 is for discussion purpose only and is not a binding recommendation.  The patient was not physically examined nor present for their treatment options. Therefore, final treatment plans cannot be decided.

## 2019-05-17 ENCOUNTER — Encounter: Payer: Self-pay | Admitting: *Deleted

## 2019-05-17 NOTE — Progress Notes (Signed)
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  Patient presented the the hospital (02/02/2019) with weeks of progressively worsening dyspnea and new onset hemoptysis.  Intermittent wheezing, cough, and continues on home oxygen 3L.  PET 05/03/2019: 20 mm nodular left parahilar soft tissue lesion identified on recent diagnostic chest CT is markedly hypermetabolic, consistent with recurrent metastatic disease.  9 mm right middle lobe pulmonary nodule shows low level FDG uptake but has been stable back to 2015.  This is probably granulomatous.  Low level uptake in the hilar regions bilaterally without discrete lymphadenopathy on today's noncontrast CT imaging.    CT Chest 04/13/2019: New ovoid perihilar nodularity in the left upper lobe is concerning for lung cancer recurrence.    02/04/2018: Per hospital discharge summary from Dr. Herbert Moors "Patient was admitted with hemoptysis and imaging findings concerning for likely primary bronchogenic carcinoma.  Patient opted not to have any further treatment done when discussion of chemotherapy and additional diagnosis were done.  Patient is not interested in having a bronchoscopy with biopsy at this time.  She will follow-up as an outpatient with both palliative care and pulmonary if she changes her mind."  CT Chest 02/01/2018: Left upper lobe mass with invasion of left hilum, worrisome for primary bronchogenic carcinoma. 4.1 x 3.5 x 4.2 cm.  Biopsies of Left Upper Lobe 06/17/2018         Tobacco/Marijuana/Snuff/ETOH use: former smoker  Past/Anticipated interventions by cardiothoracic surgery, if any:   Past/Anticipated interventions by medical oncology, if any:  Dr. Lauretta Chester 05/04/2019 -PET scan results showed that the increased soft tissue disease in her left hilum is more prominent and hypermetabolic in nature. -Due to her suboptimal health, she remains uninterested in considering  chemotherapy. -After speaking with  Radiation oncology, they do believe they can give her more radiation to her left hilum to address her cancer. -My concern is that the cancer in her left hilum has persisted despite receiving previous radiation, which raises the suspicion for these cancer cells being radiation resistant. -Nevertheless, the patient is fine with having repeat radiation done. -Radiation oncology will see her in the forthcoming days to get more sessions started.  I will see her back in 1 month for repeat clinical assessment.  Dr. Palermo1/19/2021 -We discussed possible repeat short course external beam radiation, versus possible stereotactic body radiation (SBRT). -I explained that SBRT, if feasible, would be done in [place]. -Currently, Mrs. Pulaski wishes to explore the possibility of stereotactic treatment in [place]. -I will make arrangements for her case/images to be reviewed, and be in touch with the patient accordingly.  Signs/Symptoms  Weight changes, if any: No  Respiratory complaints, if any: SOB, worse when up moving around. 3 liters O2.  Hemoptysis, if any: No, every once in a while.  No blood noted in sputum.  Pain issues, if any:  No  SAFETY ISSUES:  Prior radiation? LUL radiation in [place]-3/19-07/22/2018 3750 cGy in 15 fractions  Pacemaker/ICD? No  Possible current pregnancy? Postmenopausal  Is the patient on methotrexate? No  Current Complaints / other details:   -Patient follows with [facility] 06/24/18 for Likely non small cell ling carcinoma of LUL> seen by Trevor Mace and Bobby Rumpf Dequincey. Patient was felt that she could not tolerate chemotherapy and was adivsed Palliative radiation. Patient states she has completed radiation.

## 2019-05-18 ENCOUNTER — Other Ambulatory Visit: Payer: Self-pay

## 2019-05-18 ENCOUNTER — Ambulatory Visit
Admission: RE | Admit: 2019-05-18 | Discharge: 2019-05-18 | Disposition: A | Discharge status: Home / Self Care | Payer: Medicare Other | Source: Ambulatory Visit | Attending: Radiation Oncology | Admitting: Radiation Oncology

## 2019-05-18 ENCOUNTER — Encounter: Payer: Self-pay | Admitting: Radiation Oncology

## 2019-05-18 ENCOUNTER — Encounter: Payer: Self-pay | Admitting: *Deleted

## 2019-05-18 DIAGNOSIS — C3412 Malignant neoplasm of upper lobe, left bronchus or lung: Secondary | ICD-10-CM

## 2019-05-18 NOTE — Progress Notes (Signed)
Radiation Oncology         [phone] ________________________________  Initial Outpatient Consultation - Conducted via telephone due to current COVID-19 concerns for limiting patient exposure  I spoke with the patient to conduct this consult visit via telephone to spare the patient unnecessary potential exposure in the healthcare setting during the current COVID-19 pandemic. The patient was notified in advance and was offered a [place] meeting to allow for face to face communication but unfortunately reported that they did not have the appropriate resources/technology to support such a visit and instead preferred to proceed with a telephone consult.  ________________________________  Name: Donna Cross        MRN: [id number]  Date of Service: 05/18/2019 DOB: 10/17/1940  GE:ZMOQHU, Pcp Not In  Marice Potter, MD     REFERRING PHYSICIAN: Marice Potter, MD   DIAGNOSIS: The encounter diagnosis was Malignant neoplasm of upper lobe of left lung ([place]).   HISTORY OF PRESENT ILLNESS: Donna Cross is a 79 y.o. female seen at the request of Dr. 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  She was treated with palliative radiation in [place] between 3/19 and 07/22/2018 and received 37.5 Gy with Dr. Orlene Erm.  She has been followed in recent restaging CT scan on 04/19/2019 revealed a new ovoid perihilar nodular lesion in the left upper lobe concerning for disease.  Stable right middle lobe nodule was seen over multiple exams as well.  She underwent pet imaging on 05/03/2019 which measured this left perihilar soft tissue lesion at 20 mm with marked hypermetabolic change with an SUV of 16.9.  A right middle lobe nodule persistently seen on scans measured 9 mm with an SUV max of 1.4.  Low level bilateral hilar uptake was seen in the nodes, and her case was discussed in multidisciplinary thoracic  oncology conference to see if she might be a candidate for stereotactic body radiotherapy (SBRT).  Dr. Lisbeth Renshaw reviewed her films and her prior treatment records and she is contacted today to discuss the options of the style of therapy.  Otherwise the patient remains in observation with medical oncology.  PREVIOUS RADIATION THERAPY: Yes   07/02/2018-07/22/2018:   The patient's left upper lobe and hilum including nodes were treated to 37.5 Gy in 15 fractions   PAST MEDICAL HISTORY:  Past Medical History:  Diagnosis Date  . Asthma   . COPD (chronic obstructive pulmonary disease) ([place])   . Emphysema   . Hypertension        PAST SURGICAL HISTORY:History reviewed. No pertinent surgical history.   FAMILY HISTORY: History reviewed. No pertinent family history.   SOCIAL HISTORY:  reports that she has quit smoking. She has never used smokeless tobacco. She reports that she does not drink alcohol or use drugs.  The patient is widowed and resides in [place].  Her son Roderic Palau helps her with medical decision making and accompanies her on the call.   ALLERGIES: Penicillins   MEDICATIONS:  Current Outpatient Medications  Medication Sig Dispense Refill  . ALPRAZolam (XANAX) 0.25 MG tablet Take by mouth.    Marland Kitchen amLODipine (NORVASC) 10 MG tablet Take by mouth.    . Cholecalciferol 1.25 MG (50000 UT) capsule Take by mouth.    . enalapril (VASOTEC) 10 MG tablet Take 10 mg by mouth daily.    Marland Kitchen ezetimibe (ZETIA) 10 MG tablet Take by mouth.    Marland Kitchen  omeprazole (PRILOSEC) 20 MG capsule Take by mouth.    . Oxycodone HCl 10 MG TABS Take 10 mg by mouth every 4 (four) hours as needed (pain).   0  . OXYGEN Inhale into the lungs.    . pregabalin (LYRICA) 75 MG capsule Take by mouth.    . solifenacin (VESICARE) 10 MG tablet Take by mouth.    . TRELEGY ELLIPTA 100-62.5-25 MCG/INH AEPB Take 1 puff by mouth daily.   3  . trimethoprim-polymyxin b (POLYTRIM) ophthalmic solution Place 1 drop into both eyes 4 (four)  times daily as needed (itching).   1  . VENTOLIN HFA 108 (90 Base) MCG/ACT inhaler Inhale 2 puffs into the lungs every 6 (six) hours as needed for wheezing or shortness of breath.   1  . cyclobenzaprine (FLEXERIL) 5 MG tablet Take 5 mg by mouth 3 (three) times daily as needed for muscle spasms.      No current facility-administered medications for this encounter.     REVIEW OF SYSTEMS: On review of systems, the patient reports that she is doing well overall. She continues with 3L O2 via [place]. She  denies any chest pain, shortness of breath, cough, fevers, chills, night sweats, unintended weight changes. She denies any bowel or bladder disturbances, and denies abdominal pain, nausea or vomiting. She denies any new musculoskeletal or joint aches or pains. A complete review of systems is obtained and is otherwise negative.     PHYSICAL EXAM:  Unable to assess due to encounter type.   ECOG = 0  0 - Asymptomatic (Fully active, able to carry on all predisease activities without restriction)  1 - Symptomatic but completely ambulatory (Restricted in physically strenuous activity but ambulatory and able to carry out work of a light or sedentary nature. For example, light housework, office work)  2 - Symptomatic, <50% in bed during the day (Ambulatory and capable of all self care but unable to carry out any work activities. Up and about more than 50% of waking hours)  3 - Symptomatic, >50% in bed, but not bedbound (Capable of only limited self-care, confined to bed or chair 50% or more of waking hours)  4 - Bedbound (Completely disabled. Cannot carry on any self-care. Totally confined to bed or chair)  5 - Death   Eustace Pen MM, Creech RH, Tormey DC, et al. [phone]). "Toxicity and response criteria of the [facility]". [place] Oncol. 5 (6): 649-55    LABORATORY DATA:  Lab Results  Component Value Date   WBC 8.4 02/04/2018   HGB 9.8 (L) 02/04/2018   HCT 32.3 (L)  02/04/2018   MCV 87.1 02/04/2018   PLT 318 02/04/2018   Lab Results  Component Value Date   NA 141 02/04/2018   K 3.8 02/04/2018   CL 104 02/04/2018   CO2 31 02/04/2018   Lab Results  Component Value Date   ALT 20 02/01/2018   AST 26 02/01/2018   ALKPHOS 94 02/01/2018   BILITOT 0.4 02/01/2018      RADIOGRAPHY: No results found.     IMPRESSION/PLAN: 1. Progressive Stage IIB, cT3N0M) non-small cell lung cancer, not definitive but favor SCC of the left hilum. Dr. Lisbeth Renshaw discusses the patient's prior course of treatment and has reviewed her prior radiotherapy records.  He discusses that she would still be a candidate for additional radiotherapy with stereotactic body radiation (SBRT).  He describes the differences compared to conventional therapy.  We discussed the risks, benefits, short,  and long term effects of radiotherapy, and the patient is interested in proceeding. Dr. Lisbeth Renshaw discusses the delivery and logistics of radiotherapy and anticipates a course of 5 fractions of radiotherapy. Her course may require more than 5 fractions depending on her planning imaging and prior treatment overlap and she is aware of this. She will return for simulation at which time she will signed written consent on 05/25/19.  She will also continue to follow-up with Dr. Bobby Rumpf in surveillance.  Given current concerns for patient exposure during the COVID-19 pandemic, this encounter was conducted via telephone.  The patient has given verbal consent for this type of encounter. The time spent during this encounter was 60 minutes and 50% of that time was spent in the coordination of  her care. The attendants for this meeting include Dr. Lisbeth Renshaw, Shona Simpson, [facility] and Nani Gasser Kevan Ny. Her son Nikaela Coyne was also on the call. During the encounter, Dr. Lisbeth Renshaw and Shona Simpson [facility] were located at [facility] Radiation Oncology Department.  Berton Lan and her son were located at  home.  The above documentation reflects my direct findings during this shared patient visit. Please see the separate note by Dr. Lisbeth Renshaw on this date for the remainder of the patient's plan of care.    Carola Rhine, PAC

## 2019-05-25 ENCOUNTER — Ambulatory Visit
Admission: RE | Admit: 2019-05-25 | Discharge: 2019-05-25 | Disposition: A | Discharge status: Home / Self Care | Payer: Medicare Other | Source: Ambulatory Visit | Attending: Radiation Oncology | Admitting: Radiation Oncology

## 2019-05-25 ENCOUNTER — Other Ambulatory Visit: Payer: Self-pay

## 2019-05-25 DIAGNOSIS — C3412 Malignant neoplasm of upper lobe, left bronchus or lung: Secondary | ICD-10-CM

## 2019-05-26 ENCOUNTER — Encounter: Payer: Self-pay | Admitting: Licensed Clinical Social Worker

## 2019-05-26 NOTE — Progress Notes (Addendum)
[facility] Psychosocial Distress Screening Clinical Social Work  Clinical Social Work was referred by distress screening protocol.  The patient scored a 5 on the Psychosocial Distress Thermometer which indicates moderate distress. Clinical Social Worker contacted patient by phone to assess for distress and other psychosocial needs. Ms. Keady stated she is feeling "better" this week after learning more about her treatment plan and has no needs at this time. Main support is her son who lives nearby. CSW gave contact information and encouraged Ms. Barwick to reach out if that changes in the future.   ONCBCN DISTRESS SCREENING 05/18/2019  Screening Type Initial Screening  Distress experienced in past week (1-10) 5  Emotional problem type (No Data)  Other Contact via phone    Clinical Social Worker follow up needed: No.   Itzae Mccurdy E, LCSW

## 2019-05-31 ENCOUNTER — Ambulatory Visit: Payer: Medicare Other | Admitting: Radiation Oncology

## 2019-05-31 DIAGNOSIS — C3412 Malignant neoplasm of upper lobe, left bronchus or lung: Secondary | ICD-10-CM | POA: Diagnosis not present

## 2019-06-01 ENCOUNTER — Other Ambulatory Visit: Payer: Self-pay

## 2019-06-01 ENCOUNTER — Ambulatory Visit
Admission: RE | Admit: 2019-06-01 | Discharge: 2019-06-01 | Disposition: A | Discharge status: Home / Self Care | Payer: Medicare Other | Source: Ambulatory Visit | Attending: Radiation Oncology | Admitting: Radiation Oncology

## 2019-06-01 DIAGNOSIS — C3412 Malignant neoplasm of upper lobe, left bronchus or lung: Secondary | ICD-10-CM | POA: Diagnosis not present

## 2019-06-02 ENCOUNTER — Other Ambulatory Visit: Payer: Self-pay

## 2019-06-02 ENCOUNTER — Ambulatory Visit: Payer: Medicare Other | Admitting: Radiation Oncology

## 2019-06-02 ENCOUNTER — Ambulatory Visit
Admission: RE | Admit: 2019-06-02 | Discharge: 2019-06-02 | Disposition: A | Discharge status: Home / Self Care | Payer: Medicare Other | Source: Ambulatory Visit | Attending: Radiation Oncology | Admitting: Radiation Oncology

## 2019-06-02 DIAGNOSIS — C3412 Malignant neoplasm of upper lobe, left bronchus or lung: Secondary | ICD-10-CM | POA: Diagnosis not present

## 2019-06-03 ENCOUNTER — Ambulatory Visit
Admission: RE | Admit: 2019-06-03 | Discharge: 2019-06-03 | Disposition: A | Discharge status: Home / Self Care | Payer: Medicare Other | Source: Ambulatory Visit | Attending: Radiation Oncology | Admitting: Radiation Oncology

## 2019-06-03 ENCOUNTER — Other Ambulatory Visit: Payer: Self-pay

## 2019-06-03 DIAGNOSIS — C3412 Malignant neoplasm of upper lobe, left bronchus or lung: Secondary | ICD-10-CM | POA: Diagnosis not present

## 2019-06-04 ENCOUNTER — Ambulatory Visit: Payer: Medicare Other | Admitting: Radiation Oncology

## 2019-06-04 ENCOUNTER — Ambulatory Visit
Admission: RE | Admit: 2019-06-04 | Discharge: 2019-06-04 | Disposition: A | Discharge status: Home / Self Care | Payer: Medicare Other | Source: Ambulatory Visit | Attending: Radiation Oncology | Admitting: Radiation Oncology

## 2019-06-04 ENCOUNTER — Other Ambulatory Visit: Payer: Self-pay

## 2019-06-04 DIAGNOSIS — C3412 Malignant neoplasm of upper lobe, left bronchus or lung: Secondary | ICD-10-CM | POA: Diagnosis not present

## 2019-06-07 ENCOUNTER — Other Ambulatory Visit: Payer: Self-pay

## 2019-06-07 ENCOUNTER — Ambulatory Visit
Admission: RE | Admit: 2019-06-07 | Discharge: 2019-06-07 | Disposition: A | Discharge status: Home / Self Care | Payer: Medicare Other | Source: Ambulatory Visit | Attending: Radiation Oncology | Admitting: Radiation Oncology

## 2019-06-07 DIAGNOSIS — C3412 Malignant neoplasm of upper lobe, left bronchus or lung: Secondary | ICD-10-CM | POA: Diagnosis not present

## 2019-06-08 ENCOUNTER — Ambulatory Visit
Admission: RE | Admit: 2019-06-08 | Discharge: 2019-06-08 | Disposition: A | Discharge status: Home / Self Care | Payer: Medicare Other | Source: Ambulatory Visit | Attending: Radiation Oncology | Admitting: Radiation Oncology

## 2019-06-08 ENCOUNTER — Other Ambulatory Visit: Payer: Self-pay

## 2019-06-08 DIAGNOSIS — C3412 Malignant neoplasm of upper lobe, left bronchus or lung: Secondary | ICD-10-CM | POA: Diagnosis not present

## 2019-06-09 ENCOUNTER — Ambulatory Visit: Admission: RE | Admit: 2019-06-09 | Payer: Medicare Other | Source: Ambulatory Visit | Admitting: Radiation Oncology

## 2019-06-09 ENCOUNTER — Other Ambulatory Visit: Payer: Self-pay

## 2019-06-09 DIAGNOSIS — C3412 Malignant neoplasm of upper lobe, left bronchus or lung: Secondary | ICD-10-CM | POA: Diagnosis not present

## 2019-06-10 ENCOUNTER — Other Ambulatory Visit: Payer: Self-pay

## 2019-06-10 ENCOUNTER — Ambulatory Visit
Admission: RE | Admit: 2019-06-10 | Discharge: 2019-06-10 | Disposition: A | Discharge status: Home / Self Care | Payer: Medicare Other | Source: Ambulatory Visit | Attending: Radiation Oncology | Admitting: Radiation Oncology

## 2019-06-10 DIAGNOSIS — C3412 Malignant neoplasm of upper lobe, left bronchus or lung: Secondary | ICD-10-CM | POA: Diagnosis not present

## 2019-06-11 ENCOUNTER — Other Ambulatory Visit: Payer: Self-pay

## 2019-06-11 ENCOUNTER — Ambulatory Visit
Admission: RE | Admit: 2019-06-11 | Discharge: 2019-06-11 | Disposition: A | Discharge status: Home / Self Care | Payer: Medicare Other | Source: Ambulatory Visit | Attending: Radiation Oncology | Admitting: Radiation Oncology

## 2019-06-11 DIAGNOSIS — C3412 Malignant neoplasm of upper lobe, left bronchus or lung: Secondary | ICD-10-CM | POA: Diagnosis not present

## 2019-06-14 ENCOUNTER — Encounter: Payer: Self-pay | Admitting: Radiation Oncology

## 2019-06-14 ENCOUNTER — Other Ambulatory Visit: Payer: Self-pay

## 2019-06-14 ENCOUNTER — Ambulatory Visit
Admission: RE | Admit: 2019-06-14 | Discharge: 2019-06-14 | Disposition: A | Discharge status: Home / Self Care | Payer: Medicare Other | Source: Ambulatory Visit | Attending: Radiation Oncology | Admitting: Radiation Oncology

## 2019-06-14 DIAGNOSIS — C3412 Malignant neoplasm of upper lobe, left bronchus or lung: Secondary | ICD-10-CM | POA: Insufficient documentation

## 2019-06-23 NOTE — Addendum Note (Signed)
Encounter addended by: Kyung Rudd, MD on: 06/23/2019 11:01 AM  Actions taken: Clinical Note Signed

## 2019-06-23 NOTE — Progress Notes (Signed)
[facility] Radiation Oncology Simulation and Treatment Planning Note   Name:  Donna Cross MRN: [id number]   Date: 05/25/2019  DOB: 11/09/1940  Status:outpatient    DIAGNOSIS:    ICD-10-CM   1. Malignant neoplasm of upper lobe of left lung ([place])  C34.12      CONSENT VERIFIED:yes   SET UP: Patient is setup supine   IMMOBILIZATION: The patient was immobilized using a customized Vac Loc bag/ blue bag and customized accuform device   NARRATIVE:The patient was brought to the [place].  Identity was confirmed.  All relevant records and images related to the planned course of therapy were reviewed.  Then, the patient was positioned in a stable reproducible clinical set-up for radiation therapy. Abdominal compression was applied by me.  4D CT images were obtained and reproducible breathing pattern was confirmed. Free breathing CT images were obtained.  Skin markings were placed.  The CT images were loaded into the planning software where the target and avoidance structures were contoured.  The radiation prescription was entered and confirmed.    TREATMENT PLANNING NOTE:  Treatment planning then occurred. I have requested : IMRT planning.This treatment technique is medically necessary due to the high-dose of radiation delivered to the target region which is in close proximity to adjacent critical normal structures.  3 dimensional simulation is performed and dose volume histogram of the gross tumor volume, planning tumor volume and criticial normal structures including the spinal cord and lungs were analyzed and requested.  Special treatment procedure was performed due to high dose per fraction.  The patient will be monitored for increased risk of toxicity.  Daily imaging using cone beam CT will be used for target localization.  I anticipate that the patient will receive 50 Gy in 5 fractions to target volume. Further adjustments will be made  based on the planning process is necessary.  ------------------------------------------------  Jodelle Gross, MD, PhD

## 2019-06-23 NOTE — Progress Notes (Signed)
  Radiation Oncology         [phone]) [phone] ________________________________  Name: Donna Cross MRN: [id number]  Date: 05/25/2019  DOB: December 18, 1940  RESPIRATORY MOTION MANAGEMENT SIMULATION  NARRATIVE:  In order to account for effect of respiratory motion on target structures and other organs in the planning and delivery of radiotherapy, this patient underwent respiratory motion management simulation.  To accomplish this, when the patient was brought to the CT simulation planning suite, 4D respiratoy motion management CT images were obtained.  The CT images were loaded into the planning software.  Then, using a variety of tools including Cine, MIP, and standard views, the target volume and planning target volumes (PTV) were delineated.  Avoidance structures were contoured.  Treatment planning then occurred.  Dose volume histograms were generated and reviewed for each of the requested structure.  The resulting plan was carefully reviewed and approved today.   ------------------------------------------------  Jodelle Gross, MD, PhD

## 2019-07-14 ENCOUNTER — Telehealth: Payer: Self-pay | Admitting: Radiation Oncology

## 2019-07-14 NOTE — Telephone Encounter (Addendum)
  Radiation Oncology         [phone]) [phone] ________________________________  Name: Donna Cross MRN: [id number]  Date of Service: 07/14/2019  DOB: March 04, 1941  Post Treatment Telephone Note  Diagnosis:   Progressive Stage IIB, cT3N0M0 non-small cell lung cancer, not definitive but favor SCC of the left hilum  Interval Since Last Radiation:  4 weeks   06/01/19-06/14/19 SBRT Style Treatment: The left upper lobe target was treated to 50 Gy in 10 fractions  07/02/2018-07/22/2018:   The patient's left upper lobe and hilum including nodes were treated to 37.5 Gy in 15 fractions with Dr. Orlene Erm  Narrative:  The patient was contacted today for routine follow-up. During treatment she did very well with radiotherapy and did not have significant desquamation. She reports she is doing quite well. She continues to have chronic fatigue that predates her SBRT treatment. She has plans to follow up with Dr. Bobby Rumpf next week.  Impression/Plan: 1. Progressive Stage IIB, cT3N0M0 non-small cell lung cancer, not definitive but favor SCC of the left hilum. The patient has been doing well since completion of radiotherapy. We discussed that we would be happy to continue to follow her as needed, but she will also continue to follow up with Dr. Bobby Rumpf in medical oncology.  2. RML nodule. There is a stable nodule that will continue to be followed but could be considered at a later time for SBRT treatment if this increased in size.     Carola Rhine, PAC

## 2019-07-20 DIAGNOSIS — C3482 Malignant neoplasm of overlapping sites of left bronchus and lung: Secondary | ICD-10-CM

## 2019-08-19 DIAGNOSIS — C3482 Malignant neoplasm of overlapping sites of left bronchus and lung: Secondary | ICD-10-CM | POA: Diagnosis not present

## 2019-08-26 NOTE — Progress Notes (Signed)
  Radiation Oncology         [phone] ________________________________  Name: Donna Cross MRN: [id number]  Date: 06/14/2019  DOB: 16-Dec-1940  End of Treatment Note  Diagnosis:    Small cell lung cancer  Indication for treatment::  curative       Radiation treatment dates:   06/01/2019 through 06/2019  Site/dose:   The patient was treated to the left lung using a 3 field IMRT technique.  The patient received 50 Gray in 10 fractions.  Narrative: The patient tolerated radiation treatment relatively well.   No unexpected difficulties.  The patient's breathing did not significantly change during the course of the treatment.  Plan: The patient has completed radiation treatment. The patient will return to radiation oncology clinic for routine followup in one month. I advised the patient to call or return sooner if they have any questions or concerns related to their recovery or treatment. ________________________________  Jodelle Gross, M.D., Ph.D.

## 2020-04-21 ENCOUNTER — Telehealth: Payer: Self-pay | Admitting: Oncology

## 2020-04-21 NOTE — Telephone Encounter (Signed)
04/21/20 all appts sched and given to patient

## 2020-05-03 ENCOUNTER — Telehealth: Payer: Self-pay | Admitting: Oncology

## 2020-05-03 NOTE — Telephone Encounter (Signed)
05/03/20 spoke with patient sched ct scan and FU APPT

## 2020-05-08 ENCOUNTER — Other Ambulatory Visit: Payer: Self-pay | Admitting: Hematology and Oncology

## 2020-05-08 ENCOUNTER — Other Ambulatory Visit: Payer: Self-pay

## 2020-05-08 ENCOUNTER — Inpatient Hospital Stay: Payer: Medicare Other | Attending: Hematology and Oncology

## 2020-05-08 ENCOUNTER — Other Ambulatory Visit: Payer: Self-pay | Admitting: Oncology

## 2020-05-08 DIAGNOSIS — C3412 Malignant neoplasm of upper lobe, left bronchus or lung: Secondary | ICD-10-CM

## 2020-05-08 LAB — BASIC METABOLIC PANEL
BUN: 9 (ref 4–21)
CO2: 32 — AB (ref 13–22)
Chloride: 100 (ref 99–108)
Creatinine: 0.6 (ref 0.5–1.1)
Glucose: 134
Potassium: 3.8 (ref 3.4–5.3)
Sodium: 141 (ref 137–147)

## 2020-05-08 LAB — CBC
MCV: 74 — AB (ref 91–99)
RBC: 3 — AB (ref 3.87–5.11)
RBC: 3 — AB (ref 3.87–5.11)

## 2020-05-08 LAB — COMPREHENSIVE METABOLIC PANEL
Albumin: 3.9 (ref 3.5–5.0)
Calcium: 9.1 (ref 8.7–10.7)

## 2020-05-08 LAB — CBC AND DIFFERENTIAL
HCT: 22 — AB (ref 36–46)
Hemoglobin: 7 — AB (ref 12.0–16.0)
Neutrophils Absolute: 4.43
Neutrophils Absolute: 4.43
Platelets: 349 (ref 150–399)
WBC: 5.9
WBC: 5.9

## 2020-05-08 LAB — HEPATIC FUNCTION PANEL
ALT: 10 (ref 7–35)
AST: 25 (ref 13–35)
Alkaline Phosphatase: 83 (ref 25–125)
Bilirubin, Total: 0.4

## 2020-05-08 LAB — PROTEIN, TOTAL: Total Protein: 8.5 g/dL — AB (ref 6.3–8.2)

## 2020-05-09 ENCOUNTER — Emergency Department (HOSPITAL_BASED_OUTPATIENT_CLINIC_OR_DEPARTMENT_OTHER): Payer: Medicare Other

## 2020-05-09 ENCOUNTER — Inpatient Hospital Stay (HOSPITAL_BASED_OUTPATIENT_CLINIC_OR_DEPARTMENT_OTHER)
Admission: EM | Admit: 2020-05-09 | Discharge: 2020-05-13 | DRG: 178 | Disposition: A | Discharge status: Home Health | Payer: Medicare Other | Attending: Internal Medicine | Admitting: Internal Medicine

## 2020-05-09 ENCOUNTER — Inpatient Hospital Stay: Payer: Medicare Other | Admitting: Hematology and Oncology

## 2020-05-09 ENCOUNTER — Encounter (HOSPITAL_BASED_OUTPATIENT_CLINIC_OR_DEPARTMENT_OTHER): Payer: Self-pay | Admitting: Emergency Medicine

## 2020-05-09 ENCOUNTER — Other Ambulatory Visit: Payer: Self-pay

## 2020-05-09 DIAGNOSIS — U071 COVID-19: Secondary | ICD-10-CM | POA: Diagnosis not present

## 2020-05-09 DIAGNOSIS — J441 Chronic obstructive pulmonary disease with (acute) exacerbation: Secondary | ICD-10-CM | POA: Diagnosis present

## 2020-05-09 DIAGNOSIS — D638 Anemia in other chronic diseases classified elsewhere: Secondary | ICD-10-CM | POA: Diagnosis present

## 2020-05-09 DIAGNOSIS — Z79899 Other long term (current) drug therapy: Secondary | ICD-10-CM

## 2020-05-09 DIAGNOSIS — R627 Adult failure to thrive: Secondary | ICD-10-CM | POA: Diagnosis present

## 2020-05-09 DIAGNOSIS — J439 Emphysema, unspecified: Secondary | ICD-10-CM | POA: Diagnosis present

## 2020-05-09 DIAGNOSIS — D509 Iron deficiency anemia, unspecified: Secondary | ICD-10-CM | POA: Diagnosis not present

## 2020-05-09 DIAGNOSIS — Z66 Do not resuscitate: Secondary | ICD-10-CM | POA: Diagnosis present

## 2020-05-09 DIAGNOSIS — J9611 Chronic respiratory failure with hypoxia: Secondary | ICD-10-CM | POA: Diagnosis present

## 2020-05-09 DIAGNOSIS — I10 Essential (primary) hypertension: Secondary | ICD-10-CM | POA: Diagnosis present

## 2020-05-09 DIAGNOSIS — Z923 Personal history of irradiation: Secondary | ICD-10-CM

## 2020-05-09 DIAGNOSIS — Z85118 Personal history of other malignant neoplasm of bronchus and lung: Secondary | ICD-10-CM

## 2020-05-09 DIAGNOSIS — Z87891 Personal history of nicotine dependence: Secondary | ICD-10-CM

## 2020-05-09 DIAGNOSIS — Z9981 Dependence on supplemental oxygen: Secondary | ICD-10-CM

## 2020-05-09 DIAGNOSIS — E785 Hyperlipidemia, unspecified: Secondary | ICD-10-CM | POA: Diagnosis present

## 2020-05-09 DIAGNOSIS — Z880 Allergy status to penicillin: Secondary | ICD-10-CM

## 2020-05-09 HISTORY — DX: Malignant neoplasm of unspecified part of unspecified bronchus or lung: C34.90

## 2020-05-09 LAB — CBC WITH DIFFERENTIAL/PLATELET
Abs Immature Granulocytes: 0.01 10*3/uL (ref 0.00–0.07)
Basophils Absolute: 0 10*3/uL (ref 0.0–0.1)
Basophils Relative: 0 %
Eosinophils Absolute: 0.1 10*3/uL (ref 0.0–0.5)
Eosinophils Relative: 1 %
HCT: 25.5 % — ABNORMAL LOW (ref 36.0–46.0)
Hemoglobin: 7.5 g/dL — ABNORMAL LOW (ref 12.0–15.0)
Immature Granulocytes: 0 %
Lymphocytes Relative: 40 %
Lymphs Abs: 2.3 10*3/uL (ref 0.7–4.0)
MCH: 23.1 pg — ABNORMAL LOW (ref 26.0–34.0)
MCHC: 29.4 g/dL — ABNORMAL LOW (ref 30.0–36.0)
MCV: 78.5 fL — ABNORMAL LOW (ref 80.0–100.0)
Monocytes Absolute: 0.6 10*3/uL (ref 0.1–1.0)
Monocytes Relative: 10 %
Neutro Abs: 2.9 10*3/uL (ref 1.7–7.7)
Neutrophils Relative %: 49 %
Platelets: 427 10*3/uL — ABNORMAL HIGH (ref 150–400)
RBC: 3.25 MIL/uL — ABNORMAL LOW (ref 3.87–5.11)
RDW: 16.3 % — ABNORMAL HIGH (ref 11.5–15.5)
WBC: 5.8 10*3/uL (ref 4.0–10.5)
nRBC: 0 % (ref 0.0–0.2)

## 2020-05-09 NOTE — ED Triage Notes (Signed)
Pt c/o SOB x 2 weeks. Pt wears oxygen at home at 4L [place] with hx of COPD. Pt states that she called her PCP and that she hasnt heard back. Pt states that she had a cough but denies any exposure to covid at this time. Pt was sating 82/83 percent on her home oxygen. Pt was hunched over in the wheelchair. Pt aaox3, GCS 15. Breathing heavy during triage.

## 2020-05-09 NOTE — ED Provider Notes (Signed)
[facility] Provider Note: Georgena Spurling, MD, FACEP  CSN: [id number] MRN: [id number] ARRIVAL: 05/09/20 at 2306 ROOM: [place]  05/09/20 11:46 PM Donna Cross [name] is a 80 y.o. 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  She is here with shortness of breath for 2 weeks.  She has also had a cough has been nonproductive.  She feels like there is something that needs to be coughed up but it will come.  Her symptoms got so bad she has been unable to ambulate or perform activities of daily living due to both shortness of breath and generalized weakness.  She has had no appetite and has had little to eat.  She denies fever, pain, nausea, vomiting or diarrhea.   Past Medical History:  Diagnosis Date  . Asthma   . COPD (chronic obstructive pulmonary disease) ([place])   . Emphysema   . Hypertension   . Small cell lung cancer ([place])     History reviewed. No pertinent surgical history.  No family history on file.  Social History   Tobacco Use  . Smoking status: Former Research scientist (life sciences)  . Smokeless tobacco: Never Used  Vaping Use  . Vaping Use: Never used  Substance Use Topics  . Alcohol use: No  . Drug use: No    Prior to Admission medications   Medication Sig Start Date End Date Taking? Authorizing Provider  ALPRAZolam Duanne Moron) 0.25 MG tablet Take by mouth. 04/20/19   [provider]  amLODipine (NORVASC) 10 MG tablet Take by mouth. 03/25/19   [provider]  Cholecalciferol 1.25 MG (50000 UT) capsule Take by mouth. 04/15/19   [provider]  cyclobenzaprine (FLEXERIL) 5 MG tablet Take 5 mg by mouth 3 (three) times daily as needed for muscle spasms.     [provider]  enalapril (VASOTEC) 10 MG tablet Take 10 mg by mouth daily.    [provider]  ezetimibe (ZETIA) 10 MG tablet Take by mouth. 04/15/19    [provider]  omeprazole (PRILOSEC) 20 MG capsule Take by mouth.    [provider]  Oxycodone HCl 10 MG TABS Take 10 mg by mouth every 4 (four) hours as needed (pain).  01/14/18   [provider]  OXYGEN Inhale into the lungs.    [provider]  pregabalin (LYRICA) 75 MG capsule Take by mouth. 03/05/19   [provider]  solifenacin (VESICARE) 10 MG tablet Take by mouth. 03/25/19   [provider]  TRELEGY ELLIPTA 100-62.5-25 MCG/INH AEPB Take 1 puff by mouth daily.  12/25/17   [provider]  trimethoprim-polymyxin b (POLYTRIM) ophthalmic solution Place 1 drop into both eyes 4 (four) times daily as needed (itching).  01/12/18   [provider]  VENTOLIN HFA 108 (90 Base) MCG/ACT inhaler Inhale 2 puffs into the lungs every 6 (six) hours as needed for wheezing or shortness of breath.  12/14/17   [provider]    Allergies Penicillins   REVIEW OF SYSTEMS  Negative except as noted here or in the History of Present Illness.   PHYSICAL EXAMINATION  Initial Vital Signs Blood pressure (!) 170/89, pulse 89, temperature 98.3 F (36.8 C), temperature source Oral, resp. rate (!) 23, height 4' 9.5" (1.461 m), weight 49.5 kg, SpO2 100 %.  Examination General: Well-developed, cachectic female in no acute distress;  appearance consistent with age of record HENT: normocephalic; atraumatic Eyes: pupils equal, round and reactive to light; extraocular muscles intact; arcus senilis bilaterally Neck: supple Heart: regular rate and rhythm Lungs: Very distant sounds; shallow breaths; tachypnea: Rattly cough Abdomen: soft; nondistended; nontender; bowel sounds present Rectal: Normal sphincter tone; stool on examining glove brown, sent for Hemoccult testing Extremities: No deformity; full range of motion; pulses normal Neurologic: Awake, alert and oriented; motor function intact in all extremities and symmetric; no facial  droop Skin: Warm and dry Psychiatric: Flat affect   RESULTS  Summary of this visit's results, reviewed and interpreted by myself:   EKG Interpretation  Date/Time:  Tuesday May 09 2020 23:23:48 EST Ventricular Rate:  87 PR Interval:    QRS Duration: 76 QT Interval:  359 QTC Calculation: 432 R Axis:   86 Text Interpretation: Sinus rhythm Consider right atrial enlargement Borderline right axis deviation Borderline repolarization abnormality Artifact Confirmed by Molpus, Jenny Reichmann [id number]) on 05/09/2020 11:46:21 PM      Laboratory Studies: Results for orders placed or performed during the hospital encounter of 05/09/20 (from the past 24 hour(s))  CBC with Differential     Status: Abnormal   Collection Time: 05/09/20 11:32 PM  Result Value Ref Range   WBC 5.8 4.0 - 10.5 K/uL   RBC 3.25 (L) 3.87 - 5.11 MIL/uL   Hemoglobin 7.5 (L) 12.0 - 15.0 g/dL   HCT 25.5 (L) 36.0 - 46.0 %   MCV 78.5 (L) 80.0 - 100.0 fL   MCH 23.1 (L) 26.0 - 34.0 pg   MCHC 29.4 (L) 30.0 - 36.0 g/dL   RDW 16.3 (H) 11.5 - 15.5 %   Platelets 427 (H) 150 - 400 K/uL   nRBC 0.0 0.0 - 0.2 %   Neutrophils Relative % 49 %   Neutro Abs 2.9 1.7 - 7.7 K/uL   Lymphocytes Relative 40 %   Lymphs Abs 2.3 0.7 - 4.0 K/uL   Monocytes Relative 10 %   Monocytes Absolute 0.6 0.1 - 1.0 K/uL   Eosinophils Relative 1 %   Eosinophils Absolute 0.1 0.0 - 0.5 K/uL   Basophils Relative 0 %   Basophils Absolute 0.0 0.0 - 0.1 K/uL   Immature Granulocytes 0 %   Abs Immature Granulocytes 0.01 0.00 - 0.07 K/uL  Comprehensive metabolic panel     Status: Abnormal   Collection Time: 05/09/20 11:32 PM  Result Value Ref Range   Sodium 137 135 - 145 mmol/L   Potassium 3.9 3.5 - 5.1 mmol/L   Chloride 95 (L) 98 - 111 mmol/L   CO2 31 22 - 32 mmol/L   Glucose, Bld 156 (H) 70 - 99 mg/dL   BUN 9 8 - 23 mg/dL   Creatinine, Ser 0.63 0.44 - 1.00 mg/dL   Calcium 9.1 8.9 - 10.3 mg/dL   Total Protein 8.4 (H) 6.5 - 8.1 g/dL   Albumin 3.4 (L) 3.5 -  5.0 g/dL   AST 20 15 - 41 U/L   ALT 10 0 - 44 U/L   Alkaline Phosphatase 65 38 - 126 U/L   Total Bilirubin 0.3 0.3 - 1.2 mg/dL   GFR, Estimated >60 >60 mL/min   Anion gap 11 5 - 15  Occult blood card to lab, stool Provider will collect     Status: None   Collection Time: 05/10/20 12:05 AM  Result Value Ref Range   Fecal Occult Bld NEGATIVE NEGATIVE  SARS Coronavirus 2 by RT PCR (hospital order, performed in [facility] hospital lab) Nasopharyngeal Nasopharyngeal Swab     Status: Abnormal   Collection Time: 05/10/20 12:11 AM   Specimen: Nasopharyngeal Swab  Result Value Ref Range   SARS Coronavirus 2 POSITIVE (A) NEGATIVE   Imaging Studies: DG Chest Portable 1 View  Result Date: 05/10/2020 CLINICAL DATA:  Shortness of breath for 2 weeks. Home oxygen due to COPD. Cough. EXAM: PORTABLE CHEST 1 VIEW COMPARISON:  CT chest 05/08/2020 FINDINGS: Normal heart size and pulmonary vascularity. Emphysematous changes in the lungs. Left hilar mass with linear scarring in the left mid lung. This corresponds to known cancer with probable recurrence as indicated on the prior chest CT. No developing consolidation in the lungs. No pleural effusions. No pneumothorax. Calcification of the aorta. IMPRESSION: Left hilar mass with scarring in the left mid lung corresponding to known carcinoma. Emphysematous changes. No evidence of active infiltration. Electronically Signed   By: Lucienne Capers M.D.   On: 05/10/2020 00:12    ED COURSE and MDM  Nursing notes, initial and subsequent vitals signs, including pulse oximetry, reviewed and interpreted by myself.  Vitals:   05/10/20 0130 05/10/20 0200 05/10/20 0215 05/10/20 0600  BP: (!) 152/53 140/62 (!) 129/52 (!) 137/51  Pulse: 84 71 69 67  Resp: (!) 28 16 18 18   Temp:      TempSrc:      SpO2: 98% 100% 100% 100%  Weight:      Height:       Medications  albuterol (VENTOLIN HFA) 108 (90 Base) MCG/ACT inhaler 2 puff (has no administration in time range)   ipratropium (ATROVENT HFA) inhaler 2 puff (has no administration in time range)  remdesivir 100 mg in sodium chloride 0.9 % 100 mL IVPB (has no administration in time range)  dexamethasone (DECADRON) injection 10 mg (10 mg Intravenous Given 05/10/20 0148)  remdesivir 100 mg in sodium chloride 0.9 % 100 mL IVPB (0 mg Intravenous Stopped 05/10/20 0259)    1:14 AM The patient's Hemoccult is negative but the patient is significantly anemic with a microcytosis.  Her anemia in the setting of COPD may be contributing to her generalized weakness especially if she is not eating.  We will have her admitted for failure to thrive.  Covid test pending.  1:19 AM Patient Covid positive.  We will go ahead and administer steroids and remdesivir and have her admitted.  PROCEDURES  Procedures CRITICAL CARE Performed by: Karen Chafe Molpus Total critical care time: 30 minutes Critical care time was exclusive of separately billable procedures and treating other patients. Critical care was necessary to treat or prevent imminent or life-threatening deterioration. Critical care was time spent personally by me on the following activities: development of treatment plan with patient and/or surrogate as well as nursing, discussions with consultants, evaluation of patient's response to treatment, examination of patient, obtaining history from patient or surrogate, ordering and performing treatments and interventions, ordering and review of laboratory studies, ordering and review of radiographic studies, pulse oximetry and re-evaluation of patient's condition.   ED DIAGNOSES     ICD-10-CM   1. COVID-19 virus infection  U07.1   2. Failure to thrive in adult  R62.7   3. COPD exacerbation ([place])  J44.1   4. Microcytic anemia  D50.9        Molpus, John, MD 05/10/20 [phone]

## 2020-05-10 ENCOUNTER — Encounter (HOSPITAL_BASED_OUTPATIENT_CLINIC_OR_DEPARTMENT_OTHER): Payer: Self-pay | Admitting: Emergency Medicine

## 2020-05-10 ENCOUNTER — Inpatient Hospital Stay: Payer: Medicare Other | Admitting: Oncology

## 2020-05-10 DIAGNOSIS — J441 Chronic obstructive pulmonary disease with (acute) exacerbation: Secondary | ICD-10-CM | POA: Diagnosis present

## 2020-05-10 DIAGNOSIS — U071 COVID-19: Secondary | ICD-10-CM | POA: Diagnosis present

## 2020-05-10 DIAGNOSIS — J9611 Chronic respiratory failure with hypoxia: Secondary | ICD-10-CM | POA: Diagnosis present

## 2020-05-10 DIAGNOSIS — E785 Hyperlipidemia, unspecified: Secondary | ICD-10-CM | POA: Diagnosis present

## 2020-05-10 DIAGNOSIS — I10 Essential (primary) hypertension: Secondary | ICD-10-CM | POA: Diagnosis present

## 2020-05-10 DIAGNOSIS — Z9981 Dependence on supplemental oxygen: Secondary | ICD-10-CM | POA: Diagnosis not present

## 2020-05-10 DIAGNOSIS — J439 Emphysema, unspecified: Secondary | ICD-10-CM | POA: Diagnosis present

## 2020-05-10 DIAGNOSIS — R627 Adult failure to thrive: Secondary | ICD-10-CM | POA: Diagnosis present

## 2020-05-10 DIAGNOSIS — Z87891 Personal history of nicotine dependence: Secondary | ICD-10-CM | POA: Diagnosis not present

## 2020-05-10 DIAGNOSIS — Z79899 Other long term (current) drug therapy: Secondary | ICD-10-CM | POA: Diagnosis not present

## 2020-05-10 DIAGNOSIS — D509 Iron deficiency anemia, unspecified: Secondary | ICD-10-CM | POA: Diagnosis present

## 2020-05-10 DIAGNOSIS — Z85118 Personal history of other malignant neoplasm of bronchus and lung: Secondary | ICD-10-CM | POA: Diagnosis not present

## 2020-05-10 DIAGNOSIS — Z923 Personal history of irradiation: Secondary | ICD-10-CM | POA: Diagnosis not present

## 2020-05-10 DIAGNOSIS — Z880 Allergy status to penicillin: Secondary | ICD-10-CM | POA: Diagnosis not present

## 2020-05-10 DIAGNOSIS — Z66 Do not resuscitate: Secondary | ICD-10-CM | POA: Diagnosis present

## 2020-05-10 DIAGNOSIS — D638 Anemia in other chronic diseases classified elsewhere: Secondary | ICD-10-CM | POA: Diagnosis present

## 2020-05-10 LAB — CBC
HCT: 24.9 % — ABNORMAL LOW (ref 36.0–46.0)
Hemoglobin: 7.2 g/dL — ABNORMAL LOW (ref 12.0–15.0)
MCH: 22.7 pg — ABNORMAL LOW (ref 26.0–34.0)
MCHC: 28.9 g/dL — ABNORMAL LOW (ref 30.0–36.0)
MCV: 78.5 fL — ABNORMAL LOW (ref 80.0–100.0)
Platelets: 443 10*3/uL — ABNORMAL HIGH (ref 150–400)
RBC: 3.17 MIL/uL — ABNORMAL LOW (ref 3.87–5.11)
RDW: 16.4 % — ABNORMAL HIGH (ref 11.5–15.5)
WBC: 6 10*3/uL (ref 4.0–10.5)
nRBC: 0 % (ref 0.0–0.2)

## 2020-05-10 LAB — COMPREHENSIVE METABOLIC PANEL
ALT: 10 U/L (ref 0–44)
AST: 20 U/L (ref 15–41)
Albumin: 3.4 g/dL — ABNORMAL LOW (ref 3.5–5.0)
Alkaline Phosphatase: 65 U/L (ref 38–126)
Anion gap: 11 (ref 5–15)
BUN: 9 mg/dL (ref 8–23)
CO2: 31 mmol/L (ref 22–32)
Calcium: 9.1 mg/dL (ref 8.9–10.3)
Chloride: 95 mmol/L — ABNORMAL LOW (ref 98–111)
Creatinine, Ser: 0.63 mg/dL (ref 0.44–1.00)
GFR, Estimated: >60 mL/min (ref >60)
Glucose, Bld: 156 mg/dL — ABNORMAL HIGH (ref 70–99)
Potassium: 3.9 mmol/L (ref 3.5–5.1)
Sodium: 137 mmol/L (ref 135–145)
Total Bilirubin: 0.3 mg/dL (ref 0.3–1.2)
Total Protein: 8.4 g/dL — ABNORMAL HIGH (ref 6.5–8.1)

## 2020-05-10 LAB — OCCULT BLOOD X 1 CARD TO LAB, STOOL: Fecal Occult Bld: NEGATIVE

## 2020-05-10 LAB — CREATININE, SERUM
Creatinine, Ser: 0.55 mg/dL (ref 0.44–1.00)
GFR, Estimated: >60 mL/min (ref >60)

## 2020-05-10 LAB — SARS CORONAVIRUS 2 BY RT PCR (HOSPITAL ORDER, PERFORMED IN ~~LOC~~ HOSPITAL LAB): SARS Coronavirus 2: POSITIVE — AB

## 2020-05-10 MED ORDER — EZETIMIBE 10 MG PO TABS
10.0000 mg | ORAL_TABLET | Freq: Every day | ORAL | Status: DC
Start: 2020-05-10 — End: 2020-05-13
  Administered 2020-05-10 – 2020-05-13 (×4): 10 mg via ORAL
  Filled 2020-05-10 (×5): qty 1

## 2020-05-10 MED ORDER — ENOXAPARIN SODIUM 40 MG/0.4ML ~~LOC~~ SOLN: 40.0000 mg | SUBCUTANEOUS | Status: DC

## 2020-05-10 MED ORDER — ENOXAPARIN SODIUM 40 MG/0.4ML ~~LOC~~ SOLN
40.0000 mg | Freq: Every day | SUBCUTANEOUS | Status: DC
  Administered 2020-05-10 – 2020-05-13 (×4): 40 mg via SUBCUTANEOUS
  Filled 2020-05-10 (×4): qty 0.4

## 2020-05-10 MED ORDER — BLISTEX MEDICATED EX OINT: TOPICAL_OINTMENT | CUTANEOUS | Status: DC | PRN

## 2020-05-10 MED ORDER — ENALAPRIL MALEATE 10 MG PO TABS
10.0000 mg | ORAL_TABLET | Freq: Every day | ORAL | Status: DC
  Administered 2020-05-10 – 2020-05-12 (×3): 10 mg via ORAL
  Filled 2020-05-10: qty 2
  Filled 2020-05-10 (×4): qty 1

## 2020-05-10 MED ORDER — FLUTICASONE-UMECLIDIN-VILANT 100-62.5-25 MCG/INH IN AEPB: 1.0000 | INHALATION_SPRAY | Freq: Every day | RESPIRATORY_TRACT | Status: DC

## 2020-05-10 MED ORDER — SODIUM CHLORIDE 0.9 % IV SOLN
100.0000 mg | INTRAVENOUS | Status: AC
  Administered 2020-05-10 (×2): 100 mg via INTRAVENOUS
  Filled 2020-05-10: qty 20

## 2020-05-10 MED ORDER — FLUTICASONE FUROATE-VILANTEROL 100-25 MCG/INH IN AEPB
1.0000 | INHALATION_SPRAY | Freq: Every day | RESPIRATORY_TRACT | Status: DC
  Administered 2020-05-10 – 2020-05-13 (×4): 1 via RESPIRATORY_TRACT
  Filled 2020-05-10 (×3): qty 28

## 2020-05-10 MED ORDER — DARIFENACIN HYDROBROMIDE ER 7.5 MG PO TB24
7.5000 mg | ORAL_TABLET | Freq: Every day | ORAL | Status: DC
  Administered 2020-05-10 – 2020-05-13 (×4): 7.5 mg via ORAL
  Filled 2020-05-10 (×6): qty 1

## 2020-05-10 MED ORDER — ALPRAZOLAM 0.5 MG PO TABS
0.5000 mg | ORAL_TABLET | Freq: Two times a day (BID) | ORAL | Status: DC | PRN
  Administered 2020-05-10 – 2020-05-11 (×2): 0.5 mg via ORAL
  Filled 2020-05-10 (×2): qty 1

## 2020-05-10 MED ORDER — DEXAMETHASONE SODIUM PHOSPHATE 10 MG/ML IJ SOLN
10.0000 mg | Freq: Once | INTRAMUSCULAR | Status: AC
  Administered 2020-05-10: 10 mg via INTRAVENOUS
  Filled 2020-05-10: qty 1

## 2020-05-10 MED ORDER — ALBUTEROL SULFATE HFA 108 (90 BASE) MCG/ACT IN AERS
2.0000 | INHALATION_SPRAY | RESPIRATORY_TRACT | Status: DC | PRN
  Administered 2020-05-10: 2 via RESPIRATORY_TRACT
  Filled 2020-05-10: qty 6.7

## 2020-05-10 MED ORDER — IPRATROPIUM BROMIDE HFA 17 MCG/ACT IN AERS
2.0000 | INHALATION_SPRAY | RESPIRATORY_TRACT | Status: DC | PRN
  Filled 2020-05-10: qty 12.9

## 2020-05-10 MED ORDER — OXYCODONE HCL 5 MG PO TABS: 10.0000 mg | ORAL_TABLET | ORAL | Status: DC | PRN

## 2020-05-10 MED ORDER — PREGABALIN 75 MG PO CAPS
75.0000 mg | ORAL_CAPSULE | Freq: Two times a day (BID) | ORAL | Status: DC
Start: 2020-05-10 — End: 2020-05-13
  Administered 2020-05-11 – 2020-05-13 (×5): 75 mg via ORAL
  Filled 2020-05-10 (×7): qty 1

## 2020-05-10 MED ORDER — SODIUM CHLORIDE 0.9 % IV SOLN
100.0000 mg | Freq: Every day | INTRAVENOUS | Status: DC
  Administered 2020-05-11 – 2020-05-13 (×3): 100 mg via INTRAVENOUS
  Filled 2020-05-10 (×4): qty 20

## 2020-05-10 MED ORDER — DEXAMETHASONE 4 MG PO TABS
6.0000 mg | ORAL_TABLET | Freq: Every day | ORAL | Status: DC
  Administered 2020-05-10 – 2020-05-12 (×3): 6 mg via ORAL
  Filled 2020-05-10 (×2): qty 1

## 2020-05-10 MED ORDER — ACETAMINOPHEN 325 MG PO TABS: 650.0000 mg | ORAL_TABLET | Freq: Four times a day (QID) | ORAL | Status: DC | PRN

## 2020-05-10 MED ORDER — AMLODIPINE BESYLATE 10 MG PO TABS
10.0000 mg | ORAL_TABLET | Freq: Every day | ORAL | Status: DC
  Administered 2020-05-10 – 2020-05-13 (×4): 10 mg via ORAL
  Filled 2020-05-10: qty 2
  Filled 2020-05-10 (×3): qty 1

## 2020-05-10 MED ORDER — PANTOPRAZOLE SODIUM 40 MG PO TBEC
40.0000 mg | DELAYED_RELEASE_TABLET | Freq: Every day | ORAL | Status: DC
  Administered 2020-05-10 – 2020-05-13 (×4): 40 mg via ORAL
  Filled 2020-05-10 (×4): qty 1

## 2020-05-10 MED ORDER — UMECLIDINIUM BROMIDE 62.5 MCG/INH IN AEPB
1.0000 | INHALATION_SPRAY | Freq: Every day | RESPIRATORY_TRACT | Status: DC
  Administered 2020-05-10 – 2020-05-13 (×4): 1 via RESPIRATORY_TRACT
  Filled 2020-05-10 (×3): qty 7

## 2020-05-10 MED ORDER — ACETAMINOPHEN 650 MG RE SUPP: 650.0000 mg | Freq: Four times a day (QID) | RECTAL | Status: DC | PRN

## 2020-05-10 NOTE — ED Notes (Signed)
Pt appears to have increased work of breathing, O2 sats are 98-100%. RT notified to administer inhaler.

## 2020-05-10 NOTE — Progress Notes (Signed)
Patient arrived via stretcher from [facility], on 4L/[place], noted dyspnea on exertion. AOX4, denies nausea,vomiting and Chest pain.  Ushered to bed comfortably, call bell within reach, bed alarm at lowest position and alarm on. Skin is intact.

## 2020-05-10 NOTE — ED Notes (Signed)
On rounds noted to be resting quietly with eyes closed.  Breathing easy and unlabored.

## 2020-05-10 NOTE — Plan of Care (Signed)
  Problem: Education: Goal: Knowledge of risk factors and measures for prevention of condition will improve Outcome: Progressing   Problem: Coping: Goal: Psychosocial and spiritual needs will be supported Outcome: Progressing   Problem: Respiratory: Goal: Will maintain a patent airway Outcome: Progressing Goal: Complications related to the disease process, condition or treatment will be avoided or minimized Outcome: Progressing   

## 2020-05-10 NOTE — H&P (Signed)
History and Physical    Donna Cross FBP:[id number] DOB: 09/13/1940 DOA: 05/09/2020  PCP: Pcp, No  Patient coming from: Home.  Chief Complaint: Shortness of breath.  HPI: Donna Cross is a 80 y.o. female with severe COPD stage IV, lung cancer has completed radiation therapy has refused chemo chronic anemia hypertension hyperlipidemia has been experiencing shortness of breath and poor appetite for the last 2 weeks and presents to the ER admits in [facility].  Denies any nausea vomiting diarrhea or chest pain.  ED Course: In the ER patient was hypoxic and has been requiring 4 L but patient also uses home oxygen.  Chest x-ray shows hilar mass and Covid test was positive.  Labs are significant for worsening anemia but stool occult blood was negative.  Inflammatory markers are pending.  Patient was started on remdesivir 2 hours and admitted for Covid infection.  Review of Systems: As per HPI, rest all negative.   Past Medical History:  Diagnosis Date  . Asthma   . COPD (chronic obstructive pulmonary disease) ([place])   . Emphysema   . Hypertension   . Small cell lung cancer ([place])     History reviewed. No pertinent surgical history.   reports that she has quit smoking. She has never used smokeless tobacco. She reports that she does not drink alcohol and does not use drugs.  Allergies  Allergen Reactions  . Penicillins Other (See Comments)    Has patient had a PCN reaction causing immediate rash, facial/tongue/throat swelling, SOB or lightheadedness with hypotension: Y Has patient had a PCN reaction causing severe rash involving mucus membranes or skin necrosis: Y Has patient had a PCN reaction that required hospitalization: N Has patient had a PCN reaction occurring within the last 10 years: N If all of the above answers are "NO", then may proceed with Cephalosporin use.     History reviewed. No pertinent family history.  Prior to Admission medications    Medication Sig Start Date End Date Taking? Authorizing Provider  Cholecalciferol 1.25 MG (50000 UT) capsule Take 50,000 Units by mouth once a week. 10/25/19  Yes [provider]  ALPRAZolam Duanne Moron) 0.5 MG tablet Take 0.5 mg by mouth 2 (two) times daily as needed for anxiety. 05/02/20   [provider]  amLODipine (NORVASC) 10 MG tablet Take by mouth. 03/25/19   [provider]  cyclobenzaprine (FLEXERIL) 5 MG tablet Take 5 mg by mouth 3 (three) times daily as needed for muscle spasms.     [provider]  enalapril (VASOTEC) 10 MG tablet Take 10 mg by mouth daily.    [provider]  ezetimibe (ZETIA) 10 MG tablet Take by mouth. 04/15/19   [provider]  ipratropium-albuterol (DUONEB) 0.5-2.5 (3) MG/3ML SOLN Take by nebulization. 04/28/20   [provider]  levofloxacin (LEVAQUIN) 500 MG tablet Take 500 mg by mouth daily. 05/03/20   [provider]  omeprazole (PRILOSEC) 20 MG capsule Take by mouth.    [provider]  Oxycodone HCl 10 MG TABS Take 10 mg by mouth every 4 (four) hours as needed (pain).  01/14/18   [provider]  OXYGEN Inhale into the lungs.    [provider]  pregabalin (LYRICA) 75 MG capsule Take by mouth. 03/05/19   [provider]  solifenacin (VESICARE) 10 MG tablet Take by mouth. 03/25/19   [provider]  TRELEGY ELLIPTA 100-62.5-25 MCG/INH AEPB Take 1 puff by mouth daily.  12/25/17  [provider]  trimethoprim-polymyxin b (POLYTRIM) ophthalmic solution Place 1 drop into both eyes 4 (four) times daily as needed (itching).  01/12/18   [provider]  VENTOLIN HFA 108 (90 Base) MCG/ACT inhaler Inhale 2 puffs into the lungs every 6 (six) hours as needed for wheezing or shortness of breath.  12/14/17   [provider]    Physical Exam: Constitutional: Moderately built and nourished. Vitals:   05/10/20 2000 05/10/20 2015 05/10/20  2100 05/10/20 2219  BP: (!) 121/47 (!) 118/50 121/61 (!) 142/66  Pulse:   62 65  Resp: 17 16 16  (!) 24  Temp:   98 F (36.7 C) 98.7 F (37.1 C)  TempSrc:    Oral  SpO2: 99%  100% 100%  Weight:      Height:       Eyes: Anicteric no pallor. ENMT: No discharge from the ears eyes nose or mouth. Neck: No mass felt.  No neck rigidity. Respiratory: No rhonchi or crepitations. Cardiovascular: S1-S2 heard. Abdomen: Soft nontender bowel sounds present. Musculoskeletal: No edema. Skin: No rash. Neurologic: Alert awake oriented time place and person.  Moves all extremities. Psychiatric: Appears normal.  Normal affect.   Labs on Admission: I have personally reviewed following labs and imaging studies  CBC: Recent Labs  Lab 05/08/20 0000 05/09/20 2332  WBC 5.9  5.9 5.8  NEUTROABS 4.43  4.43 2.9  HGB 7.0* 7.5*  HCT 22* 25.5*  MCV 74* 78.5*  PLT 349 956*   Basic Metabolic Panel: Recent Labs  Lab 05/08/20 0000 05/09/20 2332  NA 141 137  K 3.8 3.9  CL 100 95*  CO2 32* 31  GLUCOSE  --  156*  BUN 9 9  CREATININE 0.6 0.63  CALCIUM 9.1 9.1   GFR: Estimated Creatinine Clearance: 40 mL/min (by C-G formula based on SCr of 0.63 mg/dL). Liver Function Tests: Recent Labs  Lab 05/08/20 0000 05/09/20 2332  AST 25 20  ALT 10 10  ALKPHOS 83 65  BILITOT  --  0.3  PROT 8.5* 8.4*  ALBUMIN 3.9 3.4*   No results for input(s): LIPASE, AMYLASE in the last 168 hours. No results for input(s): AMMONIA in the last 168 hours. Coagulation Profile: No results for input(s): INR, PROTIME in the last 168 hours. Cardiac Enzymes: No results for input(s): CKTOTAL, CKMB, CKMBINDEX, TROPONINI in the last 168 hours. BNP (last 3 results) No results for input(s): PROBNP in the last 8760 hours. HbA1C: No results for input(s): HGBA1C in the last 72 hours. CBG: No results for input(s): GLUCAP in the last 168 hours. Lipid Profile: No results for input(s): CHOL, HDL, LDLCALC, TRIG, CHOLHDL,  LDLDIRECT in the last 72 hours. Thyroid Function Tests: No results for input(s): TSH, T4TOTAL, FREET4, T3FREE, THYROIDAB in the last 72 hours. Anemia Panel: No results for input(s): VITAMINB12, FOLATE, FERRITIN, TIBC, IRON, RETICCTPCT in the last 72 hours. Urine analysis: No results found for: COLORURINE, APPEARANCEUR, LABSPEC, PHURINE, GLUCOSEU, HGBUR, BILIRUBINUR, KETONESUR, PROTEINUR, UROBILINOGEN, NITRITE, LEUKOCYTESUR Sepsis Labs: @LABRCNTIP (procalcitonin:4,lacticidven:4) ) Recent Results (from the past 240 hour(s))  SARS Coronavirus 2 by RT PCR (hospital order, performed in [facility] hospital lab) Nasopharyngeal Nasopharyngeal Swab     Status: Abnormal   Collection Time: 05/10/20 12:11 AM   Specimen: Nasopharyngeal Swab  Result Value Ref Range Status   SARS Coronavirus 2 POSITIVE (A) NEGATIVE Final    Comment: RESULT CALLED TO, READ BACK BY AND VERIFIED WITH: MISTY DAVIS RN ON 05/10/20 AT 0117 [facility] (NOTE) SARS-CoV-2 target  nucleic acids are DETECTED  SARS-CoV-2 RNA is generally detectable in upper respiratory specimens  during the acute phase of infection.  Positive results are indicative  of the presence of the identified virus, but do not rule out bacterial infection or co-infection with other pathogens not detected by the test.  Clinical correlation with patient history and  other diagnostic information is necessary to determine patient infection status.  The expected result is negative.  Fact Sheet for Patients:   StrictlyIdeas.no   Fact Sheet for Healthcare Providers:   BankingDealers.co.za    This test is not yet approved or cleared by the [place] FDA and  has been authorized for detection and/or diagnosis of SARS-CoV-2 by FDA under an Emergency Use Authorization (EUA).  This EUA will remain in effect (meaning this  test can be used) for the duration of  the COVID-19 declaration under Section 564(b)(1) of the Act,  21 U.S.C. section 360-bbb-3(b)(1), unless the authorization is terminated or revoked sooner.  Performed at [facility], [address] [place], [place] [address]      Radiological Exams on Admission: DG Chest Portable 1 View  Result Date: 05/10/2020 CLINICAL DATA:  Shortness of breath for 2 weeks. Home oxygen due to COPD. Cough. EXAM: PORTABLE CHEST 1 VIEW COMPARISON:  CT chest 05/08/2020 FINDINGS: Normal heart size and pulmonary vascularity. Emphysematous changes in the lungs. Left hilar mass with linear scarring in the left mid lung. This corresponds to known cancer with probable recurrence as indicated on the prior chest CT. No developing consolidation in the lungs. No pleural effusions. No pneumothorax. Calcification of the aorta. IMPRESSION: Left hilar mass with scarring in the left mid lung corresponding to known carcinoma. Emphysematous changes. No evidence of active infiltration. Electronically Signed   By: Lucienne Capers M.D.   On: 05/10/2020 00:12    EKG: Independently reviewed.  Normal sinus rhythm.  Assessment/Plan Principal Problem:   COVID-19 virus infection Active Problems:   Essential hypertension   Microcytic anemia   COPD exacerbation (HCC)   Failure to thrive in adult    1. COVID-19 virus infection patient was already started on IV remdesivir and steroids which we will continue for now.  Follow including markers and respiratory status. 2. Failure to thrive likely from Covid infection and known history of lung cancer. 3. COPD stage IV presently not wheezing continue inhalers. 4. Hypertension on amlodipine. 5. Worsening anemia but stool occult blood was negative.  Follow CBC.  May need transfusion if there is further decline in hemoglobin. 6. Hyperlipidemia continue home medications. 7. History of lung cancer status post completed radiotherapy patient has refused chemotherapy previously.  Since patient has COVID virus infection with failure to thrive  and severe comorbidities will need close monitoring for any further worsening in inpatient status.   DVT prophylaxis: Lovenox. Code Status: Full code. Family Communication: We will need to discuss with family. Disposition Plan: Home. Consults called: None. Admission status: Inpatient.   Rise Patience MD [facility] Pager [phone].  If 7PM-7AM, please contact night-coverage [email or web address] Password [facility]  05/10/2020, 10:36 PM

## 2020-05-10 NOTE — ED Notes (Signed)
Pt provided with warm blanket, orange juice and applesauce. Pt states she urinated in her depends multiple times throughout the night because she didn't know what to do when she had to go. Sheets changed, placed on purewick and new depends put on patient. Call bell attached to bed. All items in reach. Pt becomes increasingly short of breath with any movement. Remains on 4L [place]

## 2020-05-10 NOTE — ED Notes (Signed)
Son Wille Glaser updated on patient condition and plan of care.

## 2020-05-11 DIAGNOSIS — I10 Essential (primary) hypertension: Secondary | ICD-10-CM

## 2020-05-11 DIAGNOSIS — U071 COVID-19: Principal | ICD-10-CM

## 2020-05-11 DIAGNOSIS — D509 Iron deficiency anemia, unspecified: Secondary | ICD-10-CM

## 2020-05-11 DIAGNOSIS — R627 Adult failure to thrive: Secondary | ICD-10-CM

## 2020-05-11 DIAGNOSIS — J441 Chronic obstructive pulmonary disease with (acute) exacerbation: Secondary | ICD-10-CM

## 2020-05-11 LAB — CBC WITH DIFFERENTIAL/PLATELET
Abs Immature Granulocytes: 0.03 10*3/uL (ref 0.00–0.07)
Basophils Absolute: 0 10*3/uL (ref 0.0–0.1)
Basophils Relative: 0 %
Eosinophils Absolute: 0 10*3/uL (ref 0.0–0.5)
Eosinophils Relative: 0 %
HCT: 22 % — ABNORMAL LOW (ref 36.0–46.0)
Hemoglobin: 6.5 g/dL — CL (ref 12.0–15.0)
Immature Granulocytes: 0 %
Lymphocytes Relative: 11 %
Lymphs Abs: 0.8 10*3/uL (ref 0.7–4.0)
MCH: 23 pg — ABNORMAL LOW (ref 26.0–34.0)
MCHC: 29.5 g/dL — ABNORMAL LOW (ref 30.0–36.0)
MCV: 78 fL — ABNORMAL LOW (ref 80.0–100.0)
Monocytes Absolute: 0.3 10*3/uL (ref 0.1–1.0)
Monocytes Relative: 5 %
Neutro Abs: 6 10*3/uL (ref 1.7–7.7)
Neutrophils Relative %: 84 %
Platelets: 408 10*3/uL — ABNORMAL HIGH (ref 150–400)
RBC: 2.82 MIL/uL — ABNORMAL LOW (ref 3.87–5.11)
RDW: 16.3 % — ABNORMAL HIGH (ref 11.5–15.5)
WBC: 7.1 10*3/uL (ref 4.0–10.5)
nRBC: 0 % (ref 0.0–0.2)

## 2020-05-11 LAB — COMPREHENSIVE METABOLIC PANEL
ALT: 10 U/L (ref 0–44)
AST: 13 U/L — ABNORMAL LOW (ref 15–41)
Albumin: 3.2 g/dL — ABNORMAL LOW (ref 3.5–5.0)
Alkaline Phosphatase: 56 U/L (ref 38–126)
Anion gap: 8 (ref 5–15)
BUN: 18 mg/dL (ref 8–23)
CO2: 32 mmol/L (ref 22–32)
Calcium: 9.2 mg/dL (ref 8.9–10.3)
Chloride: 95 mmol/L — ABNORMAL LOW (ref 98–111)
Creatinine, Ser: 0.69 mg/dL (ref 0.44–1.00)
GFR, Estimated: >60 mL/min (ref >60)
Glucose, Bld: 159 mg/dL — ABNORMAL HIGH (ref 70–99)
Potassium: 4.9 mmol/L (ref 3.5–5.1)
Sodium: 135 mmol/L (ref 135–145)
Total Bilirubin: 0.2 mg/dL — ABNORMAL LOW (ref 0.3–1.2)
Total Protein: 7.3 g/dL (ref 6.5–8.1)

## 2020-05-11 LAB — PREPARE RBC (CROSSMATCH)

## 2020-05-11 LAB — HEMOGLOBIN AND HEMATOCRIT, BLOOD
HCT: 31.4 % — ABNORMAL LOW (ref 36.0–46.0)
Hemoglobin: 9.7 g/dL — ABNORMAL LOW (ref 12.0–15.0)

## 2020-05-11 LAB — C-REACTIVE PROTEIN: CRP: 3 mg/dL — ABNORMAL HIGH (ref <1.0)

## 2020-05-11 LAB — D-DIMER, QUANTITATIVE: D-Dimer, Quant: 2.16 ug/mL-FEU — ABNORMAL HIGH (ref 0.00–0.50)

## 2020-05-11 LAB — ABO/RH: ABO/RH(D): O POS

## 2020-05-11 MED ORDER — SODIUM CHLORIDE 0.9% IV SOLUTION: Freq: Once | INTRAVENOUS | Status: AC

## 2020-05-11 MED ORDER — ENSURE ENLIVE PO LIQD
237.0000 mL | Freq: Two times a day (BID) | ORAL | Status: DC
  Administered 2020-05-11 – 2020-05-13 (×4): 237 mL via ORAL

## 2020-05-11 MED ORDER — GUAIFENESIN-DM 100-10 MG/5ML PO SYRP
5.0000 mL | ORAL_SOLUTION | ORAL | Status: DC | PRN
  Administered 2020-05-11: 5 mL via ORAL
  Filled 2020-05-11 (×2): qty 10

## 2020-05-11 NOTE — Progress Notes (Signed)
CRITICAL VALUE ALERT  Critical Value:  Hgb 6.2 from 7.5  Date & Time Notied: 05/11/2020   0533  Provider Notified: Lorra Hals NP  Orders Received/Actions taken: Notified/paged/made aware; Awaiting Orders if any

## 2020-05-11 NOTE — Progress Notes (Signed)
Initial Nutrition Assessment  DOCUMENTATION CODES:   Not applicable  INTERVENTION:   -Ensure Enlive BID (8 fl oz, each supplement provides 350 kcal, 20 g protein)  NUTRITION DIAGNOSIS:   Increased nutrient needs related to acute illness (COVID-19) as evidenced by estimated needs.  GOAL:   Patient will meet greater than or equal to 90% of their needs  MONITOR:   PO intake,Supplement acceptance,Labs,Weight trends  REASON FOR ASSESSMENT:   Consult Assessment of nutrition requirement/status  ASSESSMENT:   13 YOF presented to ED with SOB and poor appetite x 2 weeks and a cough. Pt admitted for COVID-19 infection. PMH of small-cell lung cancer s/p XRT, refused chemo, severe COPD - emphysema, HTN, HLD, anemia.   Notes indicate that pt presented to ED with poor appetite x 2 weeks. However, pt indicated that her appetite had been good PTA and while at the hospital. She mentioned that today for breakfast she had sausage, oatmeal, orange juice and coffee. Pt reports that she has coffee every morning, her lunch typically consists of a sandwich with meat (bologna or ham) and she mentioned that her dinner is whatever she has cooked for her.   Pt denied any chewing or swallowing difficulties. Pt also denied any difficulty eating or feeding herself due to SOB or weakness.   Intern discussed with pt the benefits of ONS Ensure Enlive. Pt indicated that she has previously consumed ONS and would be willing to have them again while she is at the hospital.   Pt indicated that her UBW is 109#, but she felt that she had lost wt within the past 3 weeks since she's been sick. Pt's notes indicate that on 01/25 she weighed 109#. The most recent weight on record prior to 05/09/20 was 05/18/19 when the pt weighed 109#.   Pt indicated that she has been mobile PTA, but she did express concern for when she was discharged.   Meds Reviewed: Decadron (6 mg, daily), Breo Ellipta (100-25 mcg/INH 1 puff, daily)    Labs Reviewed: Chloride (95 mmol/L), Glucose (159 mg/dL)  NUTRITION - FOCUSED PHYSICAL EXAM:  Flowsheet Row Most Recent Value  Orbital Region No depletion  Upper Arm Region No depletion  Thoracic and Lumbar Region No depletion  Buccal Region Mild depletion  Temple Region Mild depletion  Clavicle Bone Region No depletion  Clavicle and Acromion Bone Region No depletion  Scapular Bone Region No depletion  Dorsal Hand Mild depletion  Patellar Region Mild depletion  Anterior Thigh Region Moderate depletion  Posterior Calf Region Moderate depletion  Edema (RD Assessment) None  Hair Reviewed  Eyes Reviewed  Mouth Reviewed  Skin Reviewed  Nails Reviewed       Diet Order:   Diet Order            Diet Heart Room service appropriate? Yes; Fluid consistency: Thin  Diet effective now                 EDUCATION NEEDS:   No education needs have been identified at this time  Skin:  Skin Assessment: Reviewed RN Assessment  Last BM:  05/10/20  Height:   Ht Readings from Last 1 Encounters:  05/09/20 4' 9.5" (1.461 m)    Weight:   Wt Readings from Last 1 Encounters:  05/09/20 49.5 kg    Ideal Body Weight:  44 kg  BMI:  Body mass index is 23.21 kg/m.  Estimated Nutritional Needs:   Kcal:  1500-1700  Protein:  75-90  Fluid:  >1.5  Enid Cutter, Dietetic Intern 05/11/2020 1:03 PM

## 2020-05-11 NOTE — Plan of Care (Signed)
  Problem: Education: Goal: Knowledge of risk factors and measures for prevention of condition will improve Outcome: Progressing   Problem: Coping: Goal: Psychosocial and spiritual needs will be supported Outcome: Progressing   Problem: Respiratory: Goal: Will maintain a patent airway Outcome: Progressing Goal: Complications related to the disease process, condition or treatment will be avoided or minimized Outcome: Progressing   

## 2020-05-11 NOTE — Progress Notes (Signed)
PT Cancellation Note  Patient Details Name: Donna Cross MRN: [id number] DOB: 04-13-1941   Cancelled Treatment:    Reason Eval/Treat Not Completed: Other (comment) Pt receiving unit of PRBCs this morning.   Darielys Giglia,KATHrine E 05/11/2020, 2:02 PM Kati PT, DPT Acute Rehabilitation Services Pager: [phone] Office: [phone]

## 2020-05-11 NOTE — TOC Progression Note (Signed)
Transition of Care [facility]) - Progression Note    Patient Details  Name: Donna Cross MRN: [id number] Date of Birth: 1941-01-19  Transition of Care [facility]) CM/SW Contact  Purcell Mouton, RN Phone Number: 05/11/2020, 1:56 PM  Clinical Narrative:     Pt from home with children. TOC will continue to follow for discharge needs.   Expected Discharge Plan: Home/Self Care Barriers to Discharge: No Barriers Identified  Expected Discharge Plan and Services Expected Discharge Plan: Home/Self Care       Living arrangements for the past 2 months: Single Family Home                                       Social Determinants of Health (SDOH) Interventions    Readmission Risk Interventions No flowsheet data found.

## 2020-05-11 NOTE — Progress Notes (Addendum)
PROGRESS NOTE  Donna Cross CVE:[id number] DOB: Jan 14, 1941 DOA: 05/09/2020 PCP: Pcp, No   LOS: 1 day   Brief narrative:  Donna Cross is a 80 y.o. female with severe COPD stage IV, lung cancer has completed radiation therapy but refused chemotherapy, chronic anemia, hypertension, hyperlipidemia presented to hospital with shortness of breath and poor appetite for 2 weeks prior to presentation. In the ER, patient was hypoxic and has been requiring 4 L but patient also uses home oxygen.  Chest x-ray showed hilar mass and Covid test was positive.  Labs were significant for worsening anemia but stool occult blood was negative.   Patient was started on remdesivir and was admitted for Covid infection.  Assessment/Plan:  Principal Problem:   COVID-19 virus infection Active Problems:   Essential hypertension   Microcytic anemia   COPD exacerbation (HCC)   Failure to thrive in adult  COVID-19 virus infection.  Chest x-ray on 05/09/2020 left hilar mass with scaring, emphysematous changes but no active infiltrates.  Currently on IV remdesivir and steroids .  Trend inflammatory markers.  Continue incentive spirometry flutter valve inhalers and supportive care.   COVID-19 Labs  Recent Labs    05/11/20 0453  DDIMER 2.16*  CRP 3.0*    Lab Results  Component Value Date   SARSCOV2NAA POSITIVE (A) 05/10/2020    Chronic hypoxic respiratory failure.  Patient initially required up to 5 L of oxygen is currently on 2 L/min.  Patient is usually at 4 L of nasal cannula oxygen at home.  We will continue with oxygen supplementation, inhalers from  History of small lung cancer status post  Radiotherapy.patient has refused chemotherapy in the past.  Follows up with oncology as outpatient.   Failure to thrive, poor oral intake for 1 day..  Likely from Covid infection and known history of lung cancer.  Will obtain nutrition consult.  COPD stage IV .  On steroids, inhalers.  Continue for  now.  Essential hypertension on amlodipine.  Worsening anemia but stool occult blood negative.    Hemoglobin was 6.5.  1 unit of packed RBC has been ordered.  No gross bleeding noted.  Will check CBC in a.m.  Generalized weakness, dyspnea on exertion.  Will get physical therapy evaluation.  Hyperlipidemia continue Zetia from home   DVT prophylaxis: enoxaparin (LOVENOX) injection 40 mg Start: 05/10/20 1000   Code Status: DNR.  I spoke with the patient about CODE STATUS and he indicated DNR.  Family Communication: I spoke with the patient's son Mr. Lauramae Kneisley and updated him about the clinical condition of the patient.  Status is: Inpatient  Remains inpatient appropriate because:IV treatments appropriate due to intensity of illness or inability to take PO and Inpatient level of care appropriate due to severity of illness   Dispo: The patient is from: Home              Anticipated d/c is to: Home, Pending PT evaluation              Anticipated d/c date is: 2 days              Patient currently is not medically stable to d/c.   Difficult to place patient No   Consultants:  None  Procedures:  None  Anti-infectives:  Darliss Cheney 1/26 >  Anti-infectives (From admission, onward)   Start     Dose/Rate Route Frequency Ordered Stop   05/11/20 1000  remdesivir 100 mg in sodium chloride 0.9 %  100 mL IVPB        100 mg 200 mL/hr over 30 Minutes Intravenous Daily 05/10/20 0138 05/15/20 0959   05/10/20 0145  remdesivir 100 mg in sodium chloride 0.9 % 100 mL IVPB        100 mg 200 mL/hr over 30 Minutes Intravenous Every 1 hr x 2 05/10/20 0138 05/10/20 0259     Subjective: Today, patient was seen and examined at bedside.  Patient complains of shortness of breath, mild cough. Poor appetite for one day. Normally, able to ambulate at home.  Feels weak..  Objective: Vitals:   05/11/20 0641 05/11/20 0705  BP: 105/61 (!) 114/59  Pulse: (!) 59 60  Resp: 18 20  Temp: 98.2 F  (36.8 C) 97.6 F (36.4 C)  SpO2: 100% 100%    Intake/Output Summary (Last 24 hours) at 05/11/2020 0719 Last data filed at 05/11/2020 0650 Gross per 24 hour  Intake 480 ml  Output 200 ml  Net 280 ml   Filed Weights   05/09/20 2341  Weight: 49.5 kg   Body mass index is 23.21 kg/m.   Physical Exam: GENERAL: Patient is alert awake and oriented. Not in obvious distress.  On nasal cannula oxygen at 3 L/min. HENT: No scleral pallor or icterus. Pupils equally reactive to light. Oral mucosa is moist NECK: is supple, no gross swelling noted. CHEST: Diminished breath sounds bilaterally.  No obvious wheezes noted. CVS: S1 and S2 heard, no murmur. Regular rate and rhythm.  ABDOMEN: Soft, non-tender, bowel sounds are present. EXTREMITIES: No edema. CNS: Cranial nerves are intact. No focal motor deficits. SKIN: warm and dry without rashes.  Data Review: I have personally reviewed the following laboratory data and studies,  CBC: Recent Labs  Lab 05/08/20 0000 05/09/20 2332 05/10/20 2253 05/11/20 0453  WBC 5.9  5.9 5.8 6.0 7.1  NEUTROABS 4.43  4.43 2.9  --  6.0  HGB 7.0* 7.5* 7.2* 6.5*  HCT 22* 25.5* 24.9* 22.0*  MCV 74* 78.5* 78.5* 78.0*  PLT 349 427* 443* 979*   Basic Metabolic Panel: Recent Labs  Lab 05/08/20 0000 05/09/20 2332 05/10/20 2253 05/11/20 0453  NA 141 137  --  135  K 3.8 3.9  --  4.9  CL 100 95*  --  95*  CO2 32* 31  --  32  GLUCOSE  --  156*  --  159*  BUN 9 9  --  18  CREATININE 0.6 0.63 0.55 0.69  CALCIUM 9.1 9.1  --  9.2   Liver Function Tests: Recent Labs  Lab 05/08/20 0000 05/09/20 2332 05/11/20 0453  AST 25 20 13*  ALT 10 10 10   ALKPHOS 83 65 56  BILITOT  --  0.3 0.2*  PROT 8.5* 8.4* 7.3  ALBUMIN 3.9 3.4* 3.2*   No results for input(s): LIPASE, AMYLASE in the last 168 hours. No results for input(s): AMMONIA in the last 168 hours. Cardiac Enzymes: No results for input(s): CKTOTAL, CKMB, CKMBINDEX, TROPONINI in the last 168 hours. BNP  (last 3 results) No results for input(s): BNP in the last 8760 hours.  ProBNP (last 3 results) No results for input(s): PROBNP in the last 8760 hours.  CBG: No results for input(s): GLUCAP in the last 168 hours. Recent Results (from the past 240 hour(s))  SARS Coronavirus 2 by RT PCR (hospital order, performed in [facility] hospital lab) Nasopharyngeal Nasopharyngeal Swab     Status: Abnormal   Collection Time: 05/10/20 12:11 AM   Specimen:  Nasopharyngeal Swab  Result Value Ref Range Status   SARS Coronavirus 2 POSITIVE (A) NEGATIVE Final    Comment: RESULT CALLED TO, READ BACK BY AND VERIFIED WITH: MISTY DAVIS RN ON 05/10/20 AT 0117 [facility] (NOTE) SARS-CoV-2 target nucleic acids are DETECTED  SARS-CoV-2 RNA is generally detectable in upper respiratory specimens  during the acute phase of infection.  Positive results are indicative  of the presence of the identified virus, but do not rule out bacterial infection or co-infection with other pathogens not detected by the test.  Clinical correlation with patient history and  other diagnostic information is necessary to determine patient infection status.  The expected result is negative.  Fact Sheet for Patients:   StrictlyIdeas.no   Fact Sheet for Healthcare Providers:   BankingDealers.co.za    This test is not yet approved or cleared by the [place] FDA and  has been authorized for detection and/or diagnosis of SARS-CoV-2 by FDA under an Emergency Use Authorization (EUA).  This EUA will remain in effect (meaning this  test can be used) for the duration of  the COVID-19 declaration under Section 564(b)(1) of the Act, 21 U.S.C. section 360-bbb-3(b)(1), unless the authorization is terminated or revoked sooner.  Performed at [facility], [address] [place], [place] [address]      Studies: DG Chest Portable 1 View  Result Date: 05/10/2020 CLINICAL DATA:   Shortness of breath for 2 weeks. Home oxygen due to COPD. Cough. EXAM: PORTABLE CHEST 1 VIEW COMPARISON:  CT chest 05/08/2020 FINDINGS: Normal heart size and pulmonary vascularity. Emphysematous changes in the lungs. Left hilar mass with linear scarring in the left mid lung. This corresponds to known cancer with probable recurrence as indicated on the prior chest CT. No developing consolidation in the lungs. No pleural effusions. No pneumothorax. Calcification of the aorta. IMPRESSION: Left hilar mass with scarring in the left mid lung corresponding to known carcinoma. Emphysematous changes. No evidence of active infiltration. Electronically Signed   By: Lucienne Capers M.D.   On: 05/10/2020 00:12      Flora Lipps, MD  [facility] 05/11/2020  If 7PM-7AM, please contact night-coverage

## 2020-05-12 LAB — CBC WITH DIFFERENTIAL/PLATELET
Abs Immature Granulocytes: 0.06 10*3/uL (ref 0.00–0.07)
Basophils Absolute: 0 10*3/uL (ref 0.0–0.1)
Basophils Relative: 0 %
Eosinophils Absolute: 0 10*3/uL (ref 0.0–0.5)
Eosinophils Relative: 0 %
HCT: 31.2 % — ABNORMAL LOW (ref 36.0–46.0)
Hemoglobin: 9.5 g/dL — ABNORMAL LOW (ref 12.0–15.0)
Immature Granulocytes: 1 %
Lymphocytes Relative: 10 %
Lymphs Abs: 0.9 10*3/uL (ref 0.7–4.0)
MCH: 24.8 pg — ABNORMAL LOW (ref 26.0–34.0)
MCHC: 30.4 g/dL (ref 30.0–36.0)
MCV: 81.5 fL (ref 80.0–100.0)
Monocytes Absolute: 0.2 10*3/uL (ref 0.1–1.0)
Monocytes Relative: 3 %
Neutro Abs: 8.3 10*3/uL — ABNORMAL HIGH (ref 1.7–7.7)
Neutrophils Relative %: 86 %
Platelets: 407 10*3/uL — ABNORMAL HIGH (ref 150–400)
RBC: 3.83 MIL/uL — ABNORMAL LOW (ref 3.87–5.11)
RDW: 16.7 % — ABNORMAL HIGH (ref 11.5–15.5)
WBC: 9.5 10*3/uL (ref 4.0–10.5)
nRBC: 0 % (ref 0.0–0.2)

## 2020-05-12 LAB — COMPREHENSIVE METABOLIC PANEL
ALT: 10 U/L (ref 0–44)
AST: 13 U/L — ABNORMAL LOW (ref 15–41)
Albumin: 3.2 g/dL — ABNORMAL LOW (ref 3.5–5.0)
Alkaline Phosphatase: 56 U/L (ref 38–126)
Anion gap: 9 (ref 5–15)
BUN: 18 mg/dL (ref 8–23)
CO2: 30 mmol/L (ref 22–32)
Calcium: 9.2 mg/dL (ref 8.9–10.3)
Chloride: 99 mmol/L (ref 98–111)
Creatinine, Ser: 0.59 mg/dL (ref 0.44–1.00)
GFR, Estimated: >60 mL/min (ref >60)
Glucose, Bld: 163 mg/dL — ABNORMAL HIGH (ref 70–99)
Potassium: 4.9 mmol/L (ref 3.5–5.1)
Sodium: 138 mmol/L (ref 135–145)
Total Bilirubin: 0.3 mg/dL (ref 0.3–1.2)
Total Protein: 7.4 g/dL (ref 6.5–8.1)

## 2020-05-12 LAB — BPAM RBC
Blood Product Expiration Date: 202202192359
ISSUE DATE / TIME: 202201270638
Unit Type and Rh: 5100

## 2020-05-12 LAB — TYPE AND SCREEN
ABO/RH(D): O POS
Antibody Screen: NEGATIVE
Unit division: 0

## 2020-05-12 LAB — MAGNESIUM: Magnesium: 1.9 mg/dL (ref 1.7–2.4)

## 2020-05-12 LAB — C-REACTIVE PROTEIN: CRP: 1.7 mg/dL — ABNORMAL HIGH (ref <1.0)

## 2020-05-12 LAB — D-DIMER, QUANTITATIVE: D-Dimer, Quant: 1.83 ug/mL-FEU — ABNORMAL HIGH (ref 0.00–0.50)

## 2020-05-12 LAB — PHOSPHORUS: Phosphorus: 3.9 mg/dL (ref 2.5–4.6)

## 2020-05-12 MED ORDER — HYDROCOD POLST-CPM POLST ER 10-8 MG/5ML PO SUER
5.0000 mL | Freq: Two times a day (BID) | ORAL | Status: DC
  Administered 2020-05-12 – 2020-05-13 (×3): 5 mL via ORAL
  Filled 2020-05-12 (×3): qty 5

## 2020-05-12 NOTE — Care Management Important Message (Signed)
Important Message  Patient Details Verbal consent given by Patient. Name: Donna Cross MRN: [id number] Date of Birth: Sep 05, 1940   Medicare Important Message Given:  Yes     Kerin Salen 05/12/2020, 1:12 PM

## 2020-05-12 NOTE — Plan of Care (Signed)
  Problem: Education: Goal: Knowledge of risk factors and measures for prevention of condition will improve Outcome: Progressing   Problem: Coping: Goal: Psychosocial and spiritual needs will be supported Outcome: Progressing   Problem: Respiratory: Goal: Will maintain a patent airway Outcome: Progressing Goal: Complications related to the disease process, condition or treatment will be avoided or minimized Outcome: Progressing   

## 2020-05-12 NOTE — Evaluation (Signed)
Physical Therapy Evaluation Patient Details Name: Donna Cross MRN: [id number] DOB: July 10, 1940 Today's Date: 05/12/2020   History of Present Illness  80 y.o. female with severe COPD stage IV, lung cancer has completed radiation therapy but refused chemotherapy, chronic anemia, hypertension, hyperlipidemia presented to hospital with shortness of breath and poor appetite for 2 weeks prior to presentation and admitted for COVID-19 virus infection  Clinical Impression  Pt admitted with above diagnosis.  Pt currently with functional limitations due to the deficits listed below (see PT Problem List). Pt will benefit from skilled PT to increase their independence and safety with mobility to allow discharge to the venue listed below.  Pt assisted with ambulating in room however only tolerated short distance.  Pt reports she has little help at home and would like more assist upon d/c.  Pt plans to return home, declines d/c to facility.     Follow Up Recommendations Home health PT;Supervision for mobility/OOB    Equipment Recommendations  None recommended by PT    Recommendations for Other Services       Precautions / Restrictions Precautions Precautions: Fall Precaution Comments: on chronic oxygen at baseline      Mobility  Bed Mobility Overal bed mobility: Needs Assistance Bed Mobility: Supine to Sit     Supine to sit: Supervision;HOB elevated          Transfers Overall transfer level: Needs assistance Equipment used: Rolling walker (2 wheeled) Transfers: Sit to/from Stand Sit to Stand: Min guard            Ambulation/Gait Ambulation/Gait assistance: Min guard Gait Distance (Feet): 15 Feet Assistive device: Rolling walker (2 wheeled) Gait Pattern/deviations: Step-through pattern;Decreased stride length;Shuffle     General Gait Details: verbal cues for use of RW, distance per pt tolerance, SpO2 93% on 2L O2 Lilly upon sitting in recliner  Stairs             Wheelchair Mobility    Modified Rankin (Stroke Patients Only)       Balance Overall balance assessment: Mild deficits observed, not formally tested                                           Pertinent Vitals/Pain Pain Assessment: No/denies pain    Home Living Family/patient expects to be discharged to:: Private residence Living Arrangements: Children;Other relatives Available Help at Discharge: Family;Available PRN/intermittently Type of Home: House Home Access: Level entry     Home Layout: One level Home Equipment: Walker - 2 wheels      Prior Function Level of Independence: Independent               Hand Dominance        Extremity/Trunk Assessment        Lower Extremity Assessment Lower Extremity Assessment: Generalized weakness       Communication   Communication: No difficulties  Cognition Arousal/Alertness: Awake/alert Behavior During Therapy: Flat affect Overall Cognitive Status: Within Functional Limits for tasks assessed                                        General Comments      Exercises     Assessment/Plan    PT Assessment Patient needs continued PT services  PT Problem List Decreased strength;Decreased  mobility;Decreased activity tolerance;Decreased balance;Decreased knowledge of use of DME;Cardiopulmonary status limiting activity       PT Treatment Interventions Gait training;DME instruction;Therapeutic exercise;Functional mobility training;Therapeutic activities;Patient/family education;Balance training    PT Goals (Current goals can be found in the Care Plan section)  Acute Rehab PT Goals PT Goal Formulation: With patient Time For Goal Achievement: 05/26/20 Potential to Achieve Goals: Good    Frequency Min 3X/week   Barriers to discharge        Co-evaluation               AM-PAC PT "6 Clicks" Mobility  Outcome Measure Help needed turning from your back to your side while  in a flat bed without using bedrails?: A Little Help needed moving from lying on your back to sitting on the side of a flat bed without using bedrails?: A Little Help needed moving to and from a bed to a chair (including a wheelchair)?: A Little Help needed standing up from a chair using your arms (e.g., wheelchair or bedside chair)?: A Little Help needed to walk in hospital room?: A Little Help needed climbing 3-5 steps with a railing? : A Little 6 Click Score: 18    End of Session Equipment Utilized During Treatment: Gait belt;Oxygen Activity Tolerance: Patient tolerated treatment well Patient left: in chair;with call bell/phone within reach;with chair alarm set Nurse Communication: Mobility status PT Visit Diagnosis: Difficulty in walking, not elsewhere classified (R26.2)    Time: 6734-1937 PT Time Calculation (min) (ACUTE ONLY): 20 min   Charges:   PT Evaluation $PT Eval Low Complexity: 1 Low        Kati PT, DPT Acute Rehabilitation Services Pager: [phone] Office: [phone]  York Ram E 05/12/2020, 12:30 PM

## 2020-05-12 NOTE — Progress Notes (Addendum)
PROGRESS NOTE  Donna Cross NFA:[id number] DOB: 08/02/1940 DOA: 05/09/2020 PCP: Pcp, No   LOS: 2 days   Brief narrative: Donna Cross is a 80 y.o. female with severe COPD stage IV, lung cancer has completed radiation therapy but refused chemotherapy, chronic anemia, hypertension, hyperlipidemia presented to hospital with shortness of breath and poor appetite for 2 weeks prior to presentation. In the ER, patient was hypoxic and has been requiring 4 L but patient also uses home oxygen.  Chest x-ray showed hilar mass and Covid test was positive.  Labs were significant for worsening anemia but stool occult blood was negative.   Patient was started on remdesivir and was admitted for Covid infection.  Assessment/Plan:  Principal Problem:   COVID-19 virus infection Active Problems:   Essential hypertension   Microcytic anemia   COPD exacerbation (HCC)   Failure to thrive in adult  COVID-19 virus infection.  Chest x-ray on 05/09/2020 left hilar mass with scaring, emphysematous changes but no active infiltrates.  High risk of deterioration due to underlying COPD and lung cancer.  Currently on IV remdesivir and steroids .  Inflammatory markers are trending down.  Continue incentive spirometry, flutter valve, inhalers and supportive care.    COVID-19 Labs  Recent Labs    05/11/20 0453 05/12/20 0506  DDIMER 2.16* 1.83*  CRP 3.0* 1.7*    Lab Results  Component Value Date   SARSCOV2NAA POSITIVE (A) 05/10/2020    Chronic hypoxic respiratory failure.  Patient initially required up to 5 L of oxygen is currently on 2 L/min.  Patient is usually at 4 L of nasal cannula oxygen at home.  We will continue with oxygen supplementation, inhalers  History of small lung cancer status post  Radiotherapy. Follows up with oncology as outpatient.  Patient states that she is in remission  Poor oral intake likely from Covid infection and known history of lung cancer.  Seen by dietary and  recommend Ensure wice daily.  COPD stage IV chronic hypoxic respiratory failure..  On steroids, inhalers.  Continue for now.  Essential hypertension on amlodipine.  Continue to monitor blood pressure.  Anemia stool occult blood negative.    Hemoglobin was 6.5.  Received 1 unit of packed RBC with hemoglobin of 9.5 today  Generalized weakness, dyspnea on exertion.  Pending physical therapy.  Hyperlipidemia continue Zetia from home  DVT prophylaxis: enoxaparin (LOVENOX) injection 40 mg Start: 05/10/20 1000   Code Status: DNR.   Family Communication:   I spoke with the patient's son Mr. Miesha Bachmann and updated him about the clinical condition of the patient today.  Status is: Inpatient  Remains inpatient appropriate because:IV treatments appropriate due to intensity of illness or inability to take PO and Inpatient level of care appropriate due to severity of illness   Dispo: The patient is from: Home              Anticipated d/c is to: Home with Home PT              Anticipated d/c date is: 1 to 2 days              Patient currently is not medically stable to d/c.   Difficult to place patient No  Consultants:  None  Procedures:  None  Anti-infectives:  Darliss Cheney 1/26 >  Anti-infectives (From admission, onward)   Start     Dose/Rate Route Frequency Ordered Stop   05/11/20 1000  remdesivir 100 mg in sodium  chloride 0.9 % 100 mL IVPB        100 mg 200 mL/hr over 30 Minutes Intravenous Daily 05/10/20 0138 05/15/20 0959   05/10/20 0145  remdesivir 100 mg in sodium chloride 0.9 % 100 mL IVPB        100 mg 200 mL/hr over 30 Minutes Intravenous Every 1 hr x 2 05/10/20 0138 05/10/20 0259     Subjective: Today, patient was seen and examined at bedside.  Denies any chest pain, fever, chills but complains of worsening cough and is unsure about going home.  Complains of generalized weakness.  Able to ambulate at home.  Objective: Vitals:   05/11/20 2028 05/12/20 0659   BP: (!) 143/59 (!) 146/82  Pulse: 65 63  Resp: (!) 22 14  Temp: 98.1 F (36.7 C) 97.6 F (36.4 C)  SpO2: 100% 100%    Intake/Output Summary (Last 24 hours) at 05/12/2020 1043 Last data filed at 05/12/2020 0903 Gross per 24 hour  Intake 820 ml  Output 975 ml  Net -155 ml   Filed Weights   05/09/20 2341  Weight: 49.5 kg   Body mass index is 23.21 kg/m.   Physical Exam: GENERAL: Patient is alert awake and oriented. Not in obvious distress.  On nasal cannula oxygen at 2 L/min.  Coughing.  Thinly built HENT: No scleral pallor or icterus. Pupils equally reactive to light. Oral mucosa is moist NECK: is supple, no gross swelling noted. CHEST: Diminished breath sounds bilaterally.  CVS: S1 and S2 heard, no murmur. Regular rate and rhythm.  ABDOMEN: Soft, non-tender, bowel sounds are present. EXTREMITIES: No edema. CNS: Cranial nerves are intact. No focal motor deficits. SKIN: warm and dry without rashes.  Data Review: I have personally reviewed the following laboratory data and studies,  CBC: Recent Labs  Lab 05/08/20 0000 05/09/20 2332 05/10/20 2253 05/11/20 0453 05/11/20 1340 05/12/20 0506  WBC 5.9  5.9 5.8 6.0 7.1  --  9.5  NEUTROABS 4.43  4.43 2.9  --  6.0  --  8.3*  HGB 7.0* 7.5* 7.2* 6.5* 9.7* 9.5*  HCT 22* 25.5* 24.9* 22.0* 31.4* 31.2*  MCV 74* 78.5* 78.5* 78.0*  --  81.5  PLT 349 427* 443* 408*  --  814*   Basic Metabolic Panel: Recent Labs  Lab 05/08/20 0000 05/09/20 2332 05/10/20 2253 05/11/20 0453 05/12/20 0506  NA 141 137  --  135 138  K 3.8 3.9  --  4.9 4.9  CL 100 95*  --  95* 99  CO2 32* 31  --  32 30  GLUCOSE  --  156*  --  159* 163*  BUN 9 9  --  18 18  CREATININE 0.6 0.63 0.55 0.69 0.59  CALCIUM 9.1 9.1  --  9.2 9.2  MG  --   --   --   --  1.9  PHOS  --   --   --   --  3.9   Liver Function Tests: Recent Labs  Lab 05/08/20 0000 05/09/20 2332 05/11/20 0453 05/12/20 0506  AST 25 20 13* 13*  ALT 10 10 10 10   ALKPHOS 83 65 56 56   BILITOT  --  0.3 0.2* 0.3  PROT 8.5* 8.4* 7.3 7.4  ALBUMIN 3.9 3.4* 3.2* 3.2*   No results for input(s): LIPASE, AMYLASE in the last 168 hours. No results for input(s): AMMONIA in the last 168 hours. Cardiac Enzymes: No results for input(s): CKTOTAL, CKMB, CKMBINDEX, TROPONINI in the last 168  hours. BNP (last 3 results) No results for input(s): BNP in the last 8760 hours.  ProBNP (last 3 results) No results for input(s): PROBNP in the last 8760 hours.  CBG: No results for input(s): GLUCAP in the last 168 hours. Recent Results (from the past 240 hour(s))  SARS Coronavirus 2 by RT PCR (hospital order, performed in [facility] hospital lab) Nasopharyngeal Nasopharyngeal Swab     Status: Abnormal   Collection Time: 05/10/20 12:11 AM   Specimen: Nasopharyngeal Swab  Result Value Ref Range Status   SARS Coronavirus 2 POSITIVE (A) NEGATIVE Final    Comment: RESULT CALLED TO, READ BACK BY AND VERIFIED WITH: MISTY DAVIS RN ON 05/10/20 AT 0117 [facility] (NOTE) SARS-CoV-2 target nucleic acids are DETECTED  SARS-CoV-2 RNA is generally detectable in upper respiratory specimens  during the acute phase of infection.  Positive results are indicative  of the presence of the identified virus, but do not rule out bacterial infection or co-infection with other pathogens not detected by the test.  Clinical correlation with patient history and  other diagnostic information is necessary to determine patient infection status.  The expected result is negative.  Fact Sheet for Patients:   StrictlyIdeas.no   Fact Sheet for Healthcare Providers:   BankingDealers.co.za    This test is not yet approved or cleared by the [place] FDA and  has been authorized for detection and/or diagnosis of SARS-CoV-2 by FDA under an Emergency Use Authorization (EUA).  This EUA will remain in effect (meaning this  test can be used) for the duration of  the COVID-19  declaration under Section 564(b)(1) of the Act, 21 U.S.C. section 360-bbb-3(b)(1), unless the authorization is terminated or revoked sooner.  Performed at [facility], [address] [place], [place] [address]      Studies: No results found.    Flora Lipps, MD  [facility] 05/12/2020  If 7PM-7AM, please contact night-coverage

## 2020-05-13 ENCOUNTER — Encounter: Payer: Self-pay | Admitting: Oncology

## 2020-05-13 LAB — CBC WITH DIFFERENTIAL/PLATELET
Abs Immature Granulocytes: 0.06 10*3/uL (ref 0.00–0.07)
Basophils Absolute: 0 10*3/uL (ref 0.0–0.1)
Basophils Relative: 0 %
Eosinophils Absolute: 0 10*3/uL (ref 0.0–0.5)
Eosinophils Relative: 0 %
HCT: 31.5 % — ABNORMAL LOW (ref 36.0–46.0)
Hemoglobin: 9.5 g/dL — ABNORMAL LOW (ref 12.0–15.0)
Immature Granulocytes: 1 %
Lymphocytes Relative: 8 %
Lymphs Abs: 0.7 10*3/uL (ref 0.7–4.0)
MCH: 24.4 pg — ABNORMAL LOW (ref 26.0–34.0)
MCHC: 30.2 g/dL (ref 30.0–36.0)
MCV: 80.8 fL (ref 80.0–100.0)
Monocytes Absolute: 0.3 10*3/uL (ref 0.1–1.0)
Monocytes Relative: 3 %
Neutro Abs: 8.1 10*3/uL — ABNORMAL HIGH (ref 1.7–7.7)
Neutrophils Relative %: 88 %
Platelets: 350 10*3/uL (ref 150–400)
RBC: 3.9 MIL/uL (ref 3.87–5.11)
RDW: 17.3 % — ABNORMAL HIGH (ref 11.5–15.5)
WBC: 9.2 10*3/uL (ref 4.0–10.5)
nRBC: 0 % (ref 0.0–0.2)

## 2020-05-13 LAB — COMPREHENSIVE METABOLIC PANEL
ALT: 10 U/L (ref 0–44)
AST: 14 U/L — ABNORMAL LOW (ref 15–41)
Albumin: 3.2 g/dL — ABNORMAL LOW (ref 3.5–5.0)
Alkaline Phosphatase: 61 U/L (ref 38–126)
Anion gap: 8 (ref 5–15)
BUN: 19 mg/dL (ref 8–23)
CO2: 31 mmol/L (ref 22–32)
Calcium: 9.1 mg/dL (ref 8.9–10.3)
Chloride: 98 mmol/L (ref 98–111)
Creatinine, Ser: 0.56 mg/dL (ref 0.44–1.00)
GFR, Estimated: >60 mL/min (ref >60)
Glucose, Bld: 174 mg/dL — ABNORMAL HIGH (ref 70–99)
Potassium: 5.6 mmol/L — ABNORMAL HIGH (ref 3.5–5.1)
Sodium: 137 mmol/L (ref 135–145)
Total Bilirubin: 0.2 mg/dL — ABNORMAL LOW (ref 0.3–1.2)
Total Protein: 7.4 g/dL (ref 6.5–8.1)

## 2020-05-13 LAB — D-DIMER, QUANTITATIVE: D-Dimer, Quant: 1.54 ug/mL-FEU — ABNORMAL HIGH (ref 0.00–0.50)

## 2020-05-13 LAB — C-REACTIVE PROTEIN: CRP: 1.4 mg/dL — ABNORMAL HIGH (ref <1.0)

## 2020-05-13 MED ORDER — DEXAMETHASONE 6 MG PO TABS: 6.0000 mg | ORAL_TABLET | Freq: Every day | ORAL | 0 refills | Status: AC

## 2020-05-13 NOTE — Discharge Summary (Signed)
Physician Discharge Summary  Donna Cross KGY:[id number] DOB: [birth year] DOA: 05/09/2020  PCP: Pcp, No  Admit date: 05/09/2020 Discharge date: 05/13/2020  Admitted From: Home  Discharge disposition: Home health  Recommendations for Outpatient Follow-Up:   . Follow up with your primary care provider in one week.  . Check CBC, BMP, magnesium in the next visit . She had a significant anemia on presentation.  Will need to closely monitor as outpatient.  Discharge Diagnosis:   Principal Problem:   COVID-19 virus infection Active Problems:   Essential hypertension   Microcytic anemia   COPD exacerbation (HCC)   Failure to thrive in adult   Discharge Condition: Improved.  Diet recommendation: Low sodium, heart healthy.    Wound care: None.  Code status: Full.   History of Present Illness:   Donna Cross a 80 y.o.femalewithsevere COPD stage IV, lung cancer has completed radiation therapy but refused chemotherapy, chronic anemia, hypertension, hyperlipidemia presented to hospital with shortness of breath and poor appetite for 2 weeks prior to presentation.In the ER, patient was hypoxic and has been requiring 4 L but patient also uses home oxygen. Chest x-ray showed hilar mass and Covid test was positive. Labs were significant for worsening anemia but stool occult blood was negative. Patient was started on remdesivir and was admitted for Covid infection.   Hospital Course:   Following conditions were addressed during hospitalization as listed below,  COVID-19 virus infection.  Chest x-ray on 05/09/2020 left hilar mass with scaring, emphysematous changes but no active infiltrates.    Patient received IV remdesivir and steroids .  Inflammatory markers trended during hospitalization which were trending down..   Patient clinically improved.  Chronic hypoxic respiratory failure.  Patient initially required up to 5 L of oxygen.  She was at her baseline  oxygen requirement prior to discharge  History of small lung cancer status post  Radiotherapy. Follows up with oncology as outpatient.  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    Continue p.o. prednisone on discharge.  Continue inhalers and oxygen at home  Essential hypertension on amlodipine.   Anemia stool occult blood was negative.  Could be anemia of chronic disease. Hemoglobin presentation was 6.5.  Received 1 unit of packed RBC during hospitalization with hemoglobin of 9.5 prior to discharge.  Generalized weakness, dyspnea on exertion.    Improved.  Hyperlipidemia continue Zetia  Disposition.  At this time, patient is stable for disposition home..  Patient will be arranged for home health PT and aide on discharge.  Patient will follow up with her primary care physician after discharge.  Medical Consultants:    None.  Procedures:    None Subjective:   Today, patient was seen and examined at bedside.  Cough has improved.  Patient is requesting home health aide at home.  No other interval complaints.  Discharge Exam:   Vitals:   05/12/20 2115 05/13/20 0616  BP: 140/68 116/69  Pulse: 60 68  Resp: 16 19  Temp: 98.1 F (36.7 C) 98.2 F (36.8 C)  SpO2: 100% 100%   Vitals:   05/12/20 0659 05/12/20 1418 05/12/20 2115 05/13/20 0616  BP: (!) 146/82 (!) 128/56 140/68 116/69  Pulse: 63 67 60 68  Resp: 14 16 16 19   Temp: 97.6 F (36.4 C) 97.9 F (36.6 C) 98.1 F (36.7 C) 98.2 F (36.8 C)  TempSrc:  Oral Oral Oral  SpO2: 100% 98% 100% 100%  Weight:      Height:       General: Alert awake, not in obvious distress, elderly female, on nasal cannula oxygen HENT: pupils equally reacting to light,  No scleral pallor or icterus noted. Oral mucosa is moist.  Chest: Diminished breath sounds bilaterally, no obvious crackles or wheezes CVS: S1 &S2 heard. No murmur.  Regular rate and rhythm. Abdomen: Soft, nontender,  nondistended.  Bowel sounds are heard.   Extremities: No cyanosis, clubbing or edema.  Peripheral pulses are palpable. Psych: Alert, awake and oriented, normal mood CNS:  No cranial nerve deficits.  Power equal in all extremities.   Skin: Warm and dry.  No rashes noted.  The results of significant diagnostics from this hospitalization (including imaging, microbiology, ancillary and laboratory) are listed below for reference.     Diagnostic Studies:   DG Chest Portable 1 View  Result Date: 05/10/2020 CLINICAL DATA:  Shortness of breath for 2 weeks. Home oxygen due to COPD. Cough. EXAM: PORTABLE CHEST 1 VIEW COMPARISON:  CT chest 05/08/2020 FINDINGS: Normal heart size and pulmonary vascularity. Emphysematous changes in the lungs. Left hilar mass with linear scarring in the left mid lung. This corresponds to known cancer with probable recurrence as indicated on the prior chest CT. No developing consolidation in the lungs. No pleural effusions. No pneumothorax. Calcification of the aorta. IMPRESSION: Left hilar mass with scarring in the left mid lung corresponding to known carcinoma. Emphysematous changes. No evidence of active infiltration. Electronically Signed   By: Lucienne Capers M.D.   On: 05/10/2020 00:12     Labs:   Basic Metabolic Panel: Recent Labs  Lab 05/08/20 0000 05/09/20 2332 05/10/20 2253 05/11/20 0453 05/12/20 0506 05/13/20 0540  NA 141 137  --  135 138 137  K 3.8 3.9  --  4.9 4.9 5.6*  CL 100 95*  --  95* 99 98  CO2 32* 31  --  32 30 31  GLUCOSE  --  156*  --  159* 163* 174*  BUN 9 9  --  18 18 19   CREATININE 0.6 0.63 0.55 0.69 0.59 0.56  CALCIUM 9.1 9.1  --  9.2 9.2 9.1  MG  --   --   --   --  1.9  --   PHOS  --   --   --   --  3.9  --    GFR Estimated Creatinine Clearance: 40 mL/min (by C-G formula based on SCr of 0.56 mg/dL). Liver Function Tests: Recent Labs  Lab 05/08/20 0000 05/09/20 2332 05/11/20 0453 05/12/20 0506 05/13/20 0540  AST 25 20 13*  13* 14*  ALT 10 10 10 10 10   ALKPHOS 83 65 56 56 61  BILITOT  --  0.3 0.2* 0.3 0.2*  PROT 8.5* 8.4* 7.3 7.4 7.4  ALBUMIN 3.9 3.4* 3.2* 3.2* 3.2*   No results for input(s): LIPASE, AMYLASE in the last 168 hours. No results for input(s): AMMONIA in the last 168 hours. Coagulation profile No results for input(s): INR, PROTIME in the last 168 hours.  CBC: Recent Labs  Lab 05/08/20 0000 05/09/20 2332 05/10/20 2253 05/11/20 0453 05/11/20 1340 05/12/20 0506 05/13/20 0540  WBC 5.9  5.9 5.8 6.0 7.1  --  9.5 9.2  NEUTROABS 4.43  4.43 2.9  --  6.0  --  8.3* 8.1*  HGB 7.0* 7.5* 7.2* 6.5* 9.7* 9.5* 9.5*  HCT 22* 25.5* 24.9* 22.0* 31.4* 31.2* 31.5*  MCV 74* 78.5* 78.5* 78.0*  --  81.5 80.8  PLT 349 427* 443* 408*  --  407* 350   Cardiac Enzymes: No results for input(s): CKTOTAL, CKMB, CKMBINDEX, TROPONINI in the last 168 hours. BNP: Invalid input(s): POCBNP CBG: No results for input(s): GLUCAP in the last 168 hours. D-Dimer Recent Labs    05/12/20 0506 05/13/20 0540  DDIMER 1.83* 1.54*   Hgb A1c No results for input(s): HGBA1C in the last 72 hours. Lipid Profile No results for input(s): CHOL, HDL, LDLCALC, TRIG, CHOLHDL, LDLDIRECT in the last 72 hours. Thyroid function studies No results for input(s): TSH, T4TOTAL, T3FREE, THYROIDAB in the last 72 hours.  Invalid input(s): FREET3 Anemia work up No results for input(s): VITAMINB12, FOLATE, FERRITIN, TIBC, IRON, RETICCTPCT in the last 72 hours. Microbiology Recent Results (from the past 240 hour(s))  SARS Coronavirus 2 by RT PCR (hospital order, performed in [facility] hospital lab) Nasopharyngeal Nasopharyngeal Swab     Status: Abnormal   Collection Time: 05/10/20 12:11 AM   Specimen: Nasopharyngeal Swab  Result Value Ref Range Status   SARS Coronavirus 2 POSITIVE (A) NEGATIVE Final    Comment: RESULT CALLED TO, READ BACK BY AND VERIFIED WITH: MISTY DAVIS RN ON 05/10/20 AT 0117 [facility] (NOTE) SARS-CoV-2 target nucleic  acids are DETECTED  SARS-CoV-2 RNA is generally detectable in upper respiratory specimens  during the acute phase of infection.  Positive results are indicative  of the presence of the identified virus, but do not rule out bacterial infection or co-infection with other pathogens not detected by the test.  Clinical correlation with patient history and  other diagnostic information is necessary to determine patient infection status.  The expected result is negative.  Fact Sheet for Patients:   StrictlyIdeas.no   Fact Sheet for Healthcare Providers:   BankingDealers.co.za    This test is not yet approved or cleared by the [place] FDA and  has been authorized for detection and/or diagnosis of SARS-CoV-2 by FDA under an Emergency Use Authorization (EUA).  This EUA will remain in effect (meaning this  test can be used) for the duration of  the COVID-19 declaration under Section 564(b)(1) of the Act, 21 U.S.C. section 360-bbb-3(b)(1), unless the authorization is terminated or revoked sooner.  Performed at [facility], [address] [place], [place] [address]      Discharge Instructions:   Discharge Instructions    Diet - low sodium heart healthy   Complete by: As directed    Discharge instructions   Complete by: As directed    Follow-up with your primary care provider in 1 week.  Complete the course of steroid.  Continue oxygen at home.  Check blood work at that time.   Increase activity slowly   Complete by: As directed      Allergies as of 05/13/2020      Reactions   Penicillins Other (See Comments)   Has patient had a PCN reaction causing immediate rash, facial/tongue/throat swelling, SOB or lightheadedness with hypotension: Y Has patient had a PCN reaction causing severe rash involving mucus membranes or skin necrosis: Y Has patient had a PCN reaction that required hospitalization: N Has patient had a PCN  reaction occurring within the last 10 years: N If all of the above answers are "NO", then may proceed with Cephalosporin use.      Medication List    STOP taking these medications   levofloxacin 500 MG tablet Commonly known as: LEVAQUIN     TAKE these medications  ALPRAZolam 0.5 MG tablet Commonly known as: XANAX Take 0.5 mg by mouth 2 (two) times daily as needed for anxiety.   amLODipine 10 MG tablet Commonly known as: NORVASC Take 10 mg by mouth daily.   aspirin 81 MG chewable tablet Chew 81 mg by mouth daily.   dexamethasone 6 MG tablet Commonly known as: DECADRON Take 1 tablet (6 mg total) by mouth at bedtime for 6 days.   ezetimibe 10 MG tablet Commonly known as: ZETIA Take 10 mg by mouth daily.   FLAXSEED OIL PO Take 1 capsule by mouth daily.   loratadine 10 MG tablet Commonly known as: CLARITIN Take 10 mg by mouth daily.   omeprazole 20 MG capsule Commonly known as: PRILOSEC Take 20 mg by mouth daily.   Oxycodone HCl 10 MG Tabs Take 10 mg by mouth every 4 (four) hours as needed (pain).   OXYGEN Inhale into the lungs.   pregabalin 75 MG capsule Commonly known as: LYRICA Take 75 mg by mouth 2 (two) times daily.   solifenacin 10 MG tablet Commonly known as: VESICARE Take 10 mg by mouth daily.   Trelegy Ellipta 100-62.5-25 MCG/INH Aepb Generic drug: Fluticasone-Umeclidin-Vilant Take 1 puff by mouth daily.   Ventolin HFA 108 (90 Base) MCG/ACT inhaler Generic drug: albuterol Inhale 2 puffs into the lungs every 6 (six) hours as needed for wheezing or shortness of breath.       Follow-up [place] at [facility] Follow up.   Specialty: Oncology Contact information: [address] [place], [facility] Follow up.   Specialty: [place] Why: This is the agency that will be providing physical therapy and an aide Contact  information: [place] [id number] [phone]                Time coordinating discharge: 39 minutes  Signed:  Sreya Froio  [facility] 05/13/2020, 1:42 PM

## 2020-05-13 NOTE — Discharge Instructions (Signed)
COVID-19 Quarantine vs. Isolation QUARANTINE keeps someone who was in close contact with someone who has COVID-19 away from others. Quarantine if you have been in close contact with someone who has COVID-19, unless you have been fully vaccinated. If you are fully vaccinated  You do NOT need to quarantine unless they have symptoms  Get tested 3-5 days after your exposure, even if you don't have symptoms  Wear a mask indoors in public for 14 days following exposure or until your test result is negative If you are not fully vaccinated  Stay home for 14 days after your last contact with a person who has COVID-19  Watch for fever (100.4F), cough, shortness of breath, or other symptoms of COVID-19  If possible, stay away from people you live with, especially people who are at higher risk for getting very sick from COVID-19  Contact your local public health department for options in your area to possibly shorten your quarantine ISOLATION keeps someone who is sick or tested positive for COVID-19 without symptoms away from others, even in their own home. People who are in isolation should stay home and stay in a specific "sick room" or area and use a separate bathroom (if available). If you are sick and think or know you have COVID-19 Stay home until after  At least 10 days since symptoms first appeared and  At least 24 hours with no fever without the use of fever-reducing medications and  Symptoms have improved If you tested positive for COVID-19 but do not have symptoms  Stay home until after 10 days have passed since your positive viral test  If you develop symptoms after testing positive, follow the steps above for those who are sick cdc.gov/coronavirus 01/10/2020 This information is not intended to replace advice given to you by your health care provider. Make sure you discuss any questions you have with your health care provider. Document Revised: 02/14/2020 Document Reviewed:  02/14/2020 Elsevier Patient Education  2021 Elsevier Inc.  

## 2020-05-13 NOTE — TOC Transition Note (Signed)
Transition of Care [facility]) - CM/SW Discharge Note   Patient Details  Name: Donna Cross MRN: [id number] Date of Birth: 11/19/1940  [facility] CM/SW Contact:  Trish Mage, LCSW Phone Number: 05/13/2020, 12:51 PM   Clinical Narrative:   Patient who is sbale for discharge today is in need of [place] PT, aide.  Contacted son who confirmed they would like services, no agency preference.  Contacted Georgina Snell with [facility] who accepted patient for services. Son will provide transportation home-I told him between 3 and 4 PM.  No further needs identified.  TOC sign off.    Final next level of care: [place] Barriers to Discharge: No Barriers Identified   Patient Goals and CMS Choice Patient states their goals for this hospitalization and ongoing recovery are:: To get better   Choice offered to / list presented to : Patient  Discharge Placement                       Discharge Plan and Services                                     Social Determinants of Health (SDOH) Interventions     Readmission Risk Interventions No flowsheet data found.

## 2020-06-07 ENCOUNTER — Telehealth: Payer: Self-pay | Admitting: Oncology

## 2020-06-07 NOTE — Telephone Encounter (Signed)
Patient called to rescheduled her 1/25 CX Follow Up Appt to 3/1 at 11:45 am

## 2020-06-13 ENCOUNTER — Ambulatory Visit: Payer: Medicare Other | Admitting: Oncology

## 2020-06-13 NOTE — Progress Notes (Signed)
Patient is unable to come to her appointment this week, due to transportation issues. I called and offered to schedule her with [facility], she does not wish to do this. She said she would call back in a week or two when her son is back in town and can give her a ride. I told her to call me back if she changes her mind and wants to use the transportation service.

## 2020-07-01 ENCOUNTER — Other Ambulatory Visit: Payer: Self-pay

## 2020-07-01 ENCOUNTER — Emergency Department (HOSPITAL_BASED_OUTPATIENT_CLINIC_OR_DEPARTMENT_OTHER): Payer: Medicare Other

## 2020-07-01 ENCOUNTER — Encounter (HOSPITAL_BASED_OUTPATIENT_CLINIC_OR_DEPARTMENT_OTHER): Payer: Self-pay

## 2020-07-01 ENCOUNTER — Emergency Department (HOSPITAL_BASED_OUTPATIENT_CLINIC_OR_DEPARTMENT_OTHER)
Admission: EM | Admit: 2020-07-01 | Discharge: 2020-07-01 | Disposition: A | Discharge status: Home / Self Care | Payer: Medicare Other | Attending: Emergency Medicine | Admitting: Emergency Medicine

## 2020-07-01 DIAGNOSIS — R0609 Other forms of dyspnea: Secondary | ICD-10-CM | POA: Diagnosis not present

## 2020-07-01 DIAGNOSIS — Z87891 Personal history of nicotine dependence: Secondary | ICD-10-CM | POA: Insufficient documentation

## 2020-07-01 DIAGNOSIS — Z79899 Other long term (current) drug therapy: Secondary | ICD-10-CM | POA: Diagnosis not present

## 2020-07-01 DIAGNOSIS — R630 Anorexia: Secondary | ICD-10-CM | POA: Insufficient documentation

## 2020-07-01 DIAGNOSIS — R531 Weakness: Secondary | ICD-10-CM | POA: Insufficient documentation

## 2020-07-01 DIAGNOSIS — J441 Chronic obstructive pulmonary disease with (acute) exacerbation: Secondary | ICD-10-CM | POA: Diagnosis not present

## 2020-07-01 DIAGNOSIS — J45909 Unspecified asthma, uncomplicated: Secondary | ICD-10-CM | POA: Diagnosis not present

## 2020-07-01 DIAGNOSIS — Z7901 Long term (current) use of anticoagulants: Secondary | ICD-10-CM | POA: Diagnosis not present

## 2020-07-01 DIAGNOSIS — J189 Pneumonia, unspecified organism: Secondary | ICD-10-CM

## 2020-07-01 DIAGNOSIS — R918 Other nonspecific abnormal finding of lung field: Secondary | ICD-10-CM

## 2020-07-01 DIAGNOSIS — I10 Essential (primary) hypertension: Secondary | ICD-10-CM | POA: Diagnosis not present

## 2020-07-01 DIAGNOSIS — Z8616 Personal history of COVID-19: Secondary | ICD-10-CM | POA: Diagnosis not present

## 2020-07-01 DIAGNOSIS — Z85118 Personal history of other malignant neoplasm of bronchus and lung: Secondary | ICD-10-CM | POA: Insufficient documentation

## 2020-07-01 DIAGNOSIS — R5383 Other fatigue: Secondary | ICD-10-CM | POA: Diagnosis not present

## 2020-07-01 DIAGNOSIS — D649 Anemia, unspecified: Secondary | ICD-10-CM

## 2020-07-01 LAB — COMPREHENSIVE METABOLIC PANEL
ALT: 10 U/L (ref 0–44)
AST: 16 U/L (ref 15–41)
Albumin: 3.4 g/dL — ABNORMAL LOW (ref 3.5–5.0)
Alkaline Phosphatase: 70 U/L (ref 38–126)
Anion gap: 11 (ref 5–15)
BUN: 10 mg/dL (ref 8–23)
CO2: 30 mmol/L (ref 22–32)
Calcium: 9.6 mg/dL (ref 8.9–10.3)
Chloride: 98 mmol/L (ref 98–111)
Creatinine, Ser: 0.55 mg/dL (ref 0.44–1.00)
GFR, Estimated: >60 mL/min (ref >60)
Glucose, Bld: 97 mg/dL (ref 70–99)
Potassium: 4.5 mmol/L (ref 3.5–5.1)
Sodium: 139 mmol/L (ref 135–145)
Total Bilirubin: 0.1 mg/dL — ABNORMAL LOW (ref 0.3–1.2)
Total Protein: 8.1 g/dL (ref 6.5–8.1)

## 2020-07-01 LAB — CBC WITH DIFFERENTIAL/PLATELET
Abs Immature Granulocytes: 0.04 10*3/uL (ref 0.00–0.07)
Basophils Absolute: 0 10*3/uL (ref 0.0–0.1)
Basophils Relative: 0 %
Eosinophils Absolute: 0.2 10*3/uL (ref 0.0–0.5)
Eosinophils Relative: 2 %
HCT: 28 % — ABNORMAL LOW (ref 36.0–46.0)
Hemoglobin: 8.4 g/dL — ABNORMAL LOW (ref 12.0–15.0)
Immature Granulocytes: 0 %
Lymphocytes Relative: 18 %
Lymphs Abs: 1.6 10*3/uL (ref 0.7–4.0)
MCH: 23.6 pg — ABNORMAL LOW (ref 26.0–34.0)
MCHC: 30 g/dL (ref 30.0–36.0)
MCV: 78.7 fL — ABNORMAL LOW (ref 80.0–100.0)
Monocytes Absolute: 0.8 10*3/uL (ref 0.1–1.0)
Monocytes Relative: 9 %
Neutro Abs: 6.4 10*3/uL (ref 1.7–7.7)
Neutrophils Relative %: 71 %
Platelets: 464 10*3/uL — ABNORMAL HIGH (ref 150–400)
RBC: 3.56 MIL/uL — ABNORMAL LOW (ref 3.87–5.11)
RDW: 17.8 % — ABNORMAL HIGH (ref 11.5–15.5)
WBC: 9.1 10*3/uL (ref 4.0–10.5)
nRBC: 0 % (ref 0.0–0.2)

## 2020-07-01 LAB — D-DIMER, QUANTITATIVE: D-Dimer, Quant: 2.1 ug/mL-FEU — ABNORMAL HIGH (ref 0.00–0.50)

## 2020-07-01 LAB — BRAIN NATRIURETIC PEPTIDE: B Natriuretic Peptide: 25.9 pg/mL (ref 0.0–100.0)

## 2020-07-01 LAB — LIPASE, BLOOD: Lipase: 26 U/L (ref 11–51)

## 2020-07-01 LAB — TROPONIN I (HIGH SENSITIVITY): Troponin I (High Sensitivity): 4 ng/L (ref <18)

## 2020-07-01 MED ORDER — FERROUS SULFATE 325 (65 FE) MG PO TABS: 325.0000 mg | ORAL_TABLET | Freq: Every day | ORAL | 0 refills | Status: AC

## 2020-07-01 MED ORDER — IOHEXOL 350 MG/ML SOLN
100.0000 mL | Freq: Once | INTRAVENOUS | Status: AC | PRN
  Administered 2020-07-01: 75 mL via INTRAVENOUS

## 2020-07-01 MED ORDER — DOXYCYCLINE HYCLATE 100 MG PO CAPS: 100.0000 mg | ORAL_CAPSULE | Freq: Two times a day (BID) | ORAL | 0 refills | Status: AC

## 2020-07-01 NOTE — Discharge Instructions (Signed)
Today on your lab work shows that you are anemic with a hemoglobin of 8.4.  This is a little bit lower than 2 months ago after you received a blood transfusion.  They thought that your body just was not making enough blood but you did not have any signs of bleeding.  Also on your x-ray today it shows that that area in the left upper part of your lung is gotten a little bit bigger and caused a pneumonia.  A combination of these things could definitely be making you feel tired.  We will prescribe iron and an antibiotic but it is very important for you to follow-up with your doctor so they can take a better look at this area.  If in the meantime you start feeling worse please return to the emergency room.  Also the iron may make you constipated so it is important to do a stool softener with this to avoid constipation.

## 2020-07-01 NOTE — ED Notes (Signed)
Only obtained small amount of blood in lt green for repeat trop. Lab will attempt to run

## 2020-07-01 NOTE — ED Notes (Signed)
Prompted to provide urine sample. Provided PO fluids. EDP at bedside.

## 2020-07-01 NOTE — ED Notes (Signed)
Pt aware urine specimen ordered. Pt reports inability to provide specimen at this time. Specimen collection device provided to patient. 

## 2020-07-01 NOTE — ED Provider Notes (Signed)
[facility] EMERGENCY DEPARTMENT Provider Note   CSN: [id number] Arrival date & time: 07/01/20  1339     History Chief Complaint  Patient presents with  . Fatigue    Donna Cross is a 80 y.o. female.  80 year old female with extensive past medical history below including small cell lung cancer, COPD on 3L home O2, HTN who p/w weakness and fatigue.  Patient was hospitalized for COVID-19 virus in January of this year.  Since then, she has remained on her typical home O2 level of 3L although she sometimes has to go up to 4L with activity.  For at least the past 1 month and perhaps since discharge from the hospital,  She reports fatigue and decreased energy with generalized weakness and some dyspnea on exertion.  She has had a decreased appetite as well.  She denies any chest or abdominal pain and she has had no focal infectious symptoms including no vomiting, diarrhea, fevers, or urinary symptoms.  She has an occasional dry cough that has been unchanged recently.  She has lost some weight lately because of her decreased appetite.  She denies any orthopnea or leg swelling.  No recent changes to her medications.  The history is provided by the patient and a relative.       Past Medical History:  Diagnosis Date  . Asthma   . COPD (chronic obstructive pulmonary disease) ([place])   . Emphysema   . Hypertension   . Small cell lung cancer [facility])     Patient Active Problem List   Diagnosis Date Noted  . COVID-19 virus infection 05/10/2020  . Microcytic anemia 05/10/2020  . COPD exacerbation ([place]) 05/10/2020  . Failure to thrive in adult 05/10/2020  . Malignant neoplasm of upper lobe of left lung ([place]) 05/18/2019  . Postobstructive pneumonia 02/02/2018  . COPD with acute exacerbation ([place]) 02/02/2018  . Lung mass 02/01/2018  . Hemoptysis 02/01/2018  . HYPERLIPIDEMIA 07/10/2007  . Essential hypertension 07/10/2007  . EMPHYSEMA 07/10/2007  . 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 OSTEOPENIA 07/10/2007    History reviewed. No pertinent surgical history.   OB History   No obstetric history on file.     History reviewed. No pertinent family history.  Social History   Tobacco Use  . Smoking status: Former Research scientist (life sciences)  . Smokeless tobacco: Never Used  Vaping Use  . Vaping Use: Never used  Substance Use Topics  . Alcohol use: No  . Drug use: No    Home Medications Prior to Admission medications   Medication Sig Start Date End Date Taking? Authorizing Provider  ALPRAZolam Duanne Moron) 0.5 MG tablet Take 0.5 mg by mouth 2 (two) times daily as needed for anxiety. 05/02/20   [provider]  amLODipine (NORVASC) 10 MG tablet Take 10 mg by mouth daily. 03/25/19   [provider]  aspirin 81 MG chewable tablet Chew 81 mg by mouth daily.    [provider]  ezetimibe (ZETIA) 10 MG tablet Take 10 mg by mouth daily. 04/15/19   [provider]  Flaxseed, Linseed, (FLAXSEED OIL PO) Take 1 capsule by mouth daily.    [provider]  loratadine (CLARITIN) 10 MG tablet Take 10 mg by mouth daily.    [provider]  omeprazole (PRILOSEC) 20 MG capsule Take 20 mg by mouth daily.    [provider]  Oxycodone HCl 10 MG TABS Take 10 mg by mouth every 4 (four) hours as needed (pain).  01/14/18  [provider]  OXYGEN Inhale into the lungs.    [provider]  pregabalin (LYRICA) 75 MG capsule Take 75 mg by mouth 2 (two) times daily. 03/05/19   [provider]  solifenacin (VESICARE) 10 MG tablet Take 10 mg by mouth daily. 03/25/19   [provider]  TRELEGY ELLIPTA 100-62.5-25 MCG/INH AEPB Take 1 puff by mouth daily.  12/25/17   [provider]  VENTOLIN HFA 108 (90 Base) MCG/ACT inhaler Inhale 2 puffs into the lungs every 6 (six) hours as needed for wheezing or shortness of breath.  12/14/17   [provider]    Allergies    Penicillins  Review of Systems   Review  of Systems All other systems reviewed and are negative except that which was mentioned in HPI  Physical Exam Updated Vital Signs BP 135/65 (BP Location: Right Arm)   Pulse 81   Temp 98.3 F (36.8 C) (Oral)   Resp 18   Ht 4' 9.5" (1.461 m)   Wt 49.4 kg   SpO2 91%   BMI 23.18 kg/m   Physical Exam Vitals and nursing note reviewed.  Constitutional:      General: She is not in acute distress.    Appearance: Normal appearance.     Comments: Frail elderly woman awake and alert  HENT:     Head: Normocephalic and atraumatic.  Eyes:     Conjunctiva/sclera: Conjunctivae normal.  Cardiovascular:     Rate and Rhythm: Normal rate and regular rhythm.     Heart sounds: Normal heart sounds. No murmur heard.   Pulmonary:     Effort: Pulmonary effort is normal.     Breath sounds: Normal breath sounds.     Comments: On 3L [place] Abdominal:     General: Abdomen is flat. Bowel sounds are normal. There is no distension.     Palpations: Abdomen is soft.     Tenderness: There is no abdominal tenderness.  Musculoskeletal:     Right lower leg: No edema.     Left lower leg: No edema.  Skin:    General: Skin is warm and dry.  Neurological:     Mental Status: She is alert and oriented to person, place, and time.     Comments: fluent  Psychiatric:        Mood and Affect: Mood normal.        Behavior: Behavior normal.     ED Results / Procedures / Treatments   Labs (all labs ordered are listed, but only abnormal results are displayed) Labs Reviewed  COMPREHENSIVE METABOLIC PANEL  LIPASE, BLOOD  BRAIN NATRIURETIC PEPTIDE  CBC WITH DIFFERENTIAL/PLATELET  TSH  URINALYSIS, ROUTINE W REFLEX MICROSCOPIC  D-DIMER, QUANTITATIVE  TROPONIN I (HIGH SENSITIVITY)    EKG None  Radiology No results found.  Procedures Procedures   Medications Ordered in ED Medications - No data to display  ED Course  I have reviewed the triage vital signs and the nursing notes.  Pertinent labs & imaging  results that were available during my care of the patient were reviewed by me and considered in my medical decision making (see chart for details).    MDM Rules/Calculators/A&P                          PT alert and comfortable on exam, 98% on 3L. EKG reassuring. DDx is broad and includes anemia, dehydration, hypothyroidism, CHF, PE, infection such as UTI. Have ordered  screening labs and CXR. PT signed out to oncoming provider pending completion of workup.  Final Clinical Impression(s) / ED Diagnoses Final diagnoses:  None    Rx / DC Orders ED Discharge Orders    None       Ernesto Lashway, Wenda Overland, MD 07/01/20 1514

## 2020-07-01 NOTE — ED Notes (Signed)
EKG given to Dr. Rex Kras

## 2020-07-01 NOTE — ED Notes (Signed)
Patient transported to X-ray 

## 2020-07-01 NOTE — ED Notes (Signed)
Patient transported to CT 

## 2020-07-01 NOTE — ED Provider Notes (Signed)
Assumed care from Dr. Rex Kras at 3:30 PM.  Patient presenting with worsening fatigue over the last few months.  Also notes worsening exertional dyspnea but denies any fever, productive cough or localized pain.  Patient with Covid in late January requiring hospitalization at that time was also found to have microcytic anemia resulting in a 1 unit blood transfusion with improvement of hemoglobin but no known cause.  Stool samples at that time were negative for blood.  Today patient CMP is within normal limits with normal electrolytes and renal function, lipase within normal limits and BNP within normal limits.  Troponin within normal limits and low suspicion that this is cardiac in nature.  Patient did have an x-ray today that showed a left upper lobe lung mass which has been present in the past but appears to be enlarging.  Also today patient's D-dimer is elevated and a CT was done which shows no evidence of PE but does show a postobstructive atelectasis or pneumonia with increasing size of the left upper lobe.  Patient reports that she did have a CT scan done and was supposed to see her doctor in [place] for this but had never gotten back to seeing him.  Also today CBC shows a hemoglobin of 8.4 which is a 1 g drop in the last 2 months.  Again most likely related to chronic anemia.  She takes no iron and has poor appetite with very little iron in her diet.  She has not noticed any dark stools and was heme-negative in the past.  She does not take anticoagulation.  She is currently on her home setting of oxygen satting 100% and in no acute respiratory distress.  Gave the patient an option of admission for evaluation of the postobstructive but no pneumonia if her symptoms were severe at home versus antibiotics, iron and close follow-up with her doctor.  Patient does not desire to be admitted at this time and will return if symptoms worsen.   Blanchie Dessert, MD 07/01/20 2048

## 2020-07-01 NOTE — ED Triage Notes (Signed)
Pt reports she is home oxygen, 3L. States she was hospitalized in January for covid. Reports she hasn't had any energy, has felt weak since then. CAO x 4. She has her home oxygen tank with her

## 2020-07-02 LAB — TSH: TSH: 1.564 u[IU]/mL (ref 0.350–4.500)

## 2020-07-03 ENCOUNTER — Telehealth: Payer: Self-pay | Admitting: Oncology

## 2020-07-03 NOTE — Telephone Encounter (Signed)
07/03/20 Patient missed last appt and was recently seen in the emergency room.

## 2020-07-03 NOTE — Progress Notes (Signed)
[facility]  [address] [place],  [place]  [address] [phone]  Clinic Day:  07/04/2020   HISTORY OF PRESENT ILLNESS:  The patient is a 80 y.o. 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  An initial lung biopsy was very suspicious, but not definitive, for squamous cell lung cancer.   Due to its very high suspicion, She underwent stereotactic radiation to this region in early March 2021.  She comes in today after she presenting to the emergency room this past weekend extremely short of breath.  While there, a chest CT was done, which showed that her left hilar mass had increased in size, with a possible postobstructive pneumonia being present.  She was sent home with oral antibiotics.  However, she comes in today being extremely short of breath with just minimal exertion.  Of note, her chest CT did not reveal a pulmonary embolus.    PHYSICAL EXAM:  Blood pressure (!) 150/70, pulse 97, temperature 98.2 F (36.8 C), resp. rate 20, height 4\' 10"  (1.473 m), weight 99 lb 12.8 oz (45.3 kg), SpO2 92 %. Wt Readings from Last 3 Encounters:  07/04/20 99 lb 12.8 oz (45.3 kg)  07/01/20 109 lb (49.4 kg)  05/09/20 109 lb 2 oz (49.5 kg)   Body mass index is 20.86 kg/m. Performance status (ECOG): 3 Physical Exam Constitutional:      Appearance: Normal appearance. She is not ill-appearing.  HENT:     Mouth/Throat:     Mouth: Mucous membranes are moist.     Pharynx: Oropharynx is clear. No oropharyngeal exudate or posterior oropharyngeal erythema.  Cardiovascular:     Rate and Rhythm: Normal rate and regular rhythm.     Heart sounds: No murmur heard. No friction rub. No gallop.   Pulmonary:     Effort: Pulmonary effort is normal. No respiratory distress.     Breath sounds: Normal breath sounds. No wheezing, rhonchi or rales.  Chest:  Breasts:     Right: No axillary adenopathy or supraclavicular adenopathy.     Left: No axillary  adenopathy or supraclavicular adenopathy.    Abdominal:     General: Bowel sounds are normal. There is no distension.     Palpations: Abdomen is soft. There is no mass.     Tenderness: There is no abdominal tenderness.  Musculoskeletal:        General: No swelling.     Right lower leg: No edema.     Left lower leg: No edema.  Lymphadenopathy:     Cervical: No cervical adenopathy.     Upper Body:     Right upper body: No supraclavicular or axillary adenopathy.     Left upper body: No supraclavicular or axillary adenopathy.     Lower Body: No right inguinal adenopathy. No left inguinal adenopathy.  Skin:    General: Skin is warm.     Coloration: Skin is not jaundiced.     Findings: No lesion or rash.  Neurological:     General: No focal deficit present.     Mental Status: She is alert and oriented to person, place, and time. Mental status is at baseline.     Cranial Nerves: Cranial nerves are intact.  Psychiatric:        Mood and Affect: Mood normal.        Behavior: Behavior normal.        Thought Content: Thought content normal.     LABS:  CBC Latest Ref Rng & Units 07/01/2020 05/13/2020 05/12/2020  WBC 4.0 - 10.5 K/uL 9.1 9.2 9.5  Hemoglobin 12.0 - 15.0 g/dL 8.4(L) 9.5(L) 9.5(L)  Hematocrit 36.0 - 46.0 % 28.0(L) 31.5(L) 31.2(L)  Platelets 150 - 400 K/uL 464(H) 350 407(H)   CMP Latest Ref Rng & Units 07/01/2020 05/13/2020 05/12/2020  Glucose 70 - 99 mg/dL 97 174(H) 163(H)  BUN 8 - 23 mg/dL 10 19 18   Creatinine 0.44 - 1.00 mg/dL 0.55 0.56 0.59  Sodium 135 - 145 mmol/L 139 137 138  Potassium 3.5 - 5.1 mmol/L 4.5 5.6(H) 4.9  Chloride 98 - 111 mmol/L 98 98 99  CO2 22 - 32 mmol/L 30 31 30   Calcium 8.9 - 10.3 mg/dL 9.6 9.1 9.2  Total Protein 6.5 - 8.1 g/dL 8.1 7.4 7.4  Total Bilirubin 0.3 - 1.2 mg/dL 0.1(L) 0.2(L) 0.3  Alkaline Phos 38 - 126 U/L 70 61 56  AST 15 - 41 U/L 16 14(L) 13(L)  ALT 0 - 44 U/L 10 10 10     STUDIES:  DG Chest 2 View  Result Date:  07/01/2020 CLINICAL DATA:  Weakness EXAM: CHEST - 2 VIEW COMPARISON:  05/09/2020 FINDINGS: There is hyperinflation of the lungs compatible with COPD. Right perihilar mass again noted as seen on prior study. Increasing postobstructive atelectasis or pneumonia. No effusions. No confluent opacity on the right. No acute bony abnormality. IMPRESSION: Left perihilar mass with increasing postobstructive atelectasis or infiltrate. COPD. Electronically Signed   By: Rolm Baptise M.D.   On: 07/01/2020 15:21   CT Angio Chest PE W and/or Wo Contrast  Result Date: 07/01/2020 CLINICAL DATA:  Positive D-dimer.  PE suspected. EXAM: CT ANGIOGRAPHY CHEST WITH CONTRAST TECHNIQUE: Multidetector CT imaging of the chest was performed using the standard protocol during bolus administration of intravenous contrast. Multiplanar CT image reconstructions and MIPs were obtained to evaluate the vascular anatomy. CONTRAST:  40mL OMNIPAQUE IOHEXOL 350 MG/ML SOLN COMPARISON:  05/08/2020 FINDINGS: Cardiovascular: No filling defects in the pulmonary arteries to suggest pulmonary emboli. Heart is borderline in size. Diffuse coronary artery calcifications and scattered aortic calcifications. No evidence of aortic aneurysm. Mediastinum/Nodes: Abnormal soft tissue in the left hilum associated with the perihilar mass. No mediastinal adenopathy or axillary adenopathy. Trachea and esophagus are unremarkable. Thyroid unremarkable. Lungs/Pleura: Left upper lobe perihilar mass and postobstructive process has progressed since prior study. Difficult to measure the exact size of the mass due to the postobstructive atelectasis or pneumonia. The mass encases the left pulmonary artery. 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 Emphysema. No confluent opacity on the right. No effusions. Upper Abdomen: Imaging into the upper abdomen demonstrates no acute findings. Musculoskeletal: Chest wall soft tissues are unremarkable. No acute bony  abnormality. Review of the MIP images confirms the above findings. IMPRESSION: No evidence of pulmonary embolus. Left upper lobe/perihilar mass and resulting upper lobe postobstructive atelectasis or pneumonia has increased since prior study. Is difficult to measure the mass due to postobstructive airspace disease. Diffuse coronary artery disease. Aortic Atherosclerosis (ICD10-I70.0) and Emphysema (ICD10-J43.9). Electronically Signed   By: Rolm Baptise M.D.   On: 07/01/2020 18:11     ASSESSMENT & PLAN:  Assessment/Plan:  A 80 y.o. female with stage IIB (T3 N0 M0) lung cancer, who completed stereotactic radiation to her left hilar region in March 2021.  She also had conventional radiation to her left hilum in 2020.   In clinic today, I went over all of her CT images with her, for  which she could see that her left hilar mass has clearly gotten larger.  In the past, the patient repeatedly stated how she was not interested in chemotherapy.  With her worsening performance status, chemotherapy cannot even be considered now, even if she wanted it.  I did speak with radiation oncology, who feels more radiation can be given to her left hilar region to retard the growth of her locoregional disease.  However, I am concerned with how well she will respond to additional radiation as the cells in this region have already been exposed to both stereotactic and conventional radiation.  Unfortunately, there are not many other attractive options at this time.  If this patient's clinical condition does not improve after radiation, hospice may be imminent.  Hematologically, this patient has a low hemoglobin, elevated platelets and a low MCV level.  This collection of findings is mostly consistent with iron deficiency anemia; based upon this, I will arrange for her to receive IV Feraheme 1020 mg over these next few weeks to replenish her iron stores and improve her hemoglobin.  I will see her back in 2 months for repeat clinical  assessment.  The patient understands all the plans discussed today and is in agreement with them.      Kendell Sagraves Macarthur Critchley, MD

## 2020-07-04 ENCOUNTER — Other Ambulatory Visit: Payer: Self-pay

## 2020-07-04 ENCOUNTER — Telehealth: Payer: Self-pay | Admitting: Oncology

## 2020-07-04 ENCOUNTER — Other Ambulatory Visit: Payer: Self-pay | Admitting: Oncology

## 2020-07-04 ENCOUNTER — Inpatient Hospital Stay: Payer: Medicare Other | Attending: Hematology and Oncology | Admitting: Oncology

## 2020-07-04 VITALS — BP 150/70 | HR 97 | Temp 98.2°F | Resp 20 | Ht <= 58 in | Wt 99.8 lb

## 2020-07-04 DIAGNOSIS — C3412 Malignant neoplasm of upper lobe, left bronchus or lung: Secondary | ICD-10-CM

## 2020-07-04 DIAGNOSIS — J439 Emphysema, unspecified: Secondary | ICD-10-CM | POA: Insufficient documentation

## 2020-07-04 DIAGNOSIS — D509 Iron deficiency anemia, unspecified: Secondary | ICD-10-CM

## 2020-07-04 DIAGNOSIS — R531 Weakness: Secondary | ICD-10-CM | POA: Insufficient documentation

## 2020-07-04 DIAGNOSIS — Z79899 Other long term (current) drug therapy: Secondary | ICD-10-CM | POA: Insufficient documentation

## 2020-07-04 DIAGNOSIS — I700 Atherosclerosis of aorta: Secondary | ICD-10-CM | POA: Insufficient documentation

## 2020-07-04 DIAGNOSIS — R0602 Shortness of breath: Secondary | ICD-10-CM | POA: Insufficient documentation

## 2020-07-04 NOTE — Telephone Encounter (Signed)
Per 3/22 LOS, patient scheduled for May Appt's.  Gave patient Appt Summary

## 2020-07-07 ENCOUNTER — Telehealth: Payer: Self-pay | Admitting: Oncology

## 2020-07-07 NOTE — Telephone Encounter (Signed)
07/07/20 spoke with patient and scheduled IV IRON

## 2020-07-11 ENCOUNTER — Other Ambulatory Visit: Payer: Self-pay | Admitting: Hematology and Oncology

## 2020-07-11 MED ORDER — OXYCODONE HCL 10 MG PO TABS: 10.0000 mg | ORAL_TABLET | ORAL | 0 refills | Status: DC | PRN

## 2020-07-12 ENCOUNTER — Other Ambulatory Visit: Payer: Self-pay

## 2020-07-12 ENCOUNTER — Inpatient Hospital Stay: Payer: Medicare Other

## 2020-07-12 ENCOUNTER — Other Ambulatory Visit: Payer: Self-pay | Admitting: Hematology and Oncology

## 2020-07-12 VITALS — BP 110/50 | HR 74 | Temp 98.1°F | Resp 20 | Ht <= 58 in | Wt 94.8 lb

## 2020-07-12 DIAGNOSIS — I700 Atherosclerosis of aorta: Secondary | ICD-10-CM | POA: Diagnosis not present

## 2020-07-12 DIAGNOSIS — D509 Iron deficiency anemia, unspecified: Secondary | ICD-10-CM | POA: Diagnosis not present

## 2020-07-12 DIAGNOSIS — C3412 Malignant neoplasm of upper lobe, left bronchus or lung: Secondary | ICD-10-CM | POA: Diagnosis not present

## 2020-07-12 DIAGNOSIS — J439 Emphysema, unspecified: Secondary | ICD-10-CM | POA: Diagnosis not present

## 2020-07-12 DIAGNOSIS — R0602 Shortness of breath: Secondary | ICD-10-CM | POA: Diagnosis not present

## 2020-07-12 DIAGNOSIS — Z79899 Other long term (current) drug therapy: Secondary | ICD-10-CM | POA: Diagnosis not present

## 2020-07-12 DIAGNOSIS — R531 Weakness: Secondary | ICD-10-CM | POA: Diagnosis not present

## 2020-07-12 MED ORDER — SODIUM CHLORIDE 0.9 % IV SOLN
Freq: Once | INTRAVENOUS | Status: AC
  Filled 2020-07-12: qty 250

## 2020-07-12 MED ORDER — OXYCODONE HCL 10 MG PO TABS: 10.0000 mg | ORAL_TABLET | ORAL | 0 refills | Status: DC | PRN

## 2020-07-12 MED ORDER — SODIUM CHLORIDE 0.9 % IV SOLN
510.0000 mg | Freq: Once | INTRAVENOUS | Status: AC
  Administered 2020-07-12: 510 mg via INTRAVENOUS
  Filled 2020-07-12: qty 510

## 2020-07-12 NOTE — Patient Instructions (Signed)
Ferumoxytol injection What is this medicine? FERUMOXYTOL is an iron complex. Iron is used to make healthy red blood cells, which carry oxygen and nutrients throughout the body. This medicine is used to treat iron deficiency anemia. This medicine may be used for other purposes; ask your health care provider or pharmacist if you have questions. COMMON BRAND NAME(S): Feraheme What should I tell my health care provider before I take this medicine? They need to know if you have any of these conditions:  anemia not caused by low iron levels  high levels of iron in the blood  magnetic resonance imaging (MRI) test scheduled  an unusual or allergic reaction to iron, other medicines, foods, dyes, or preservatives  pregnant or trying to get pregnant  breast-feeding How should I use this medicine? This medicine is for injection into a vein. It is given by a health care professional in a hospital or clinic setting. Talk to your pediatrician regarding the use of this medicine in children. Special care may be needed. Overdosage: If you think you have taken too much of this medicine contact a poison control center or emergency room at once. NOTE: This medicine is only for you. Do not share this medicine with others. What if I miss a dose? It is important not to miss your dose. Call your doctor or health care professional if you are unable to keep an appointment. What may interact with this medicine? This medicine may interact with the following medications:  other iron products This list may not describe all possible interactions. Give your health care provider a list of all the medicines, herbs, non-prescription drugs, or dietary supplements you use. Also tell them if you smoke, drink alcohol, or use illegal drugs. Some items may interact with your medicine. What should I watch for while using this medicine? Visit your doctor or healthcare professional regularly. Tell your doctor or healthcare  professional if your symptoms do not start to get better or if they get worse. You may need blood work done while you are taking this medicine. You may need to follow a special diet. Talk to your doctor. Foods that contain iron include: whole grains/cereals, dried fruits, beans, or peas, leafy green vegetables, and organ meats (liver, kidney). What side effects may I notice from receiving this medicine? Side effects that you should report to your doctor or health care professional as soon as possible:  allergic reactions like skin rash, itching or hives, swelling of the face, lips, or tongue  breathing problems  changes in blood pressure  feeling faint or lightheaded, falls  fever or chills  flushing, sweating, or hot feelings  swelling of the ankles or feet Side effects that usually do not require medical attention (report to your doctor or health care professional if they continue or are bothersome):  diarrhea  headache  nausea, vomiting  stomach pain This list may not describe all possible side effects. Call your doctor for medical advice about side effects. You may report side effects to FDA at 1-800-FDA-1088. Where should I keep my medicine? This drug is given in a hospital or clinic and will not be stored at home. NOTE: This sheet is a summary. It may not cover all possible information. If you have questions about this medicine, talk to your doctor, pharmacist, or health care provider.  2021 Elsevier/Gold Standard (2016-05-20 20:21:10)  

## 2020-07-12 NOTE — Progress Notes (Signed)
I TALKED TO THE PATIENT SON REGARDING PATIENT LOW BLOOD PRESSURES. SHE TAKES BLOOD PRESSURE MEDICINE DAILY. I INFORMED THEM TO CALL HER PRIMARY REGARDING HOW LOW THEY WERE TODAY AND DISCUSS POSSIBLE LOWING MEDICINES FOR HER BLOOD PRESSURE. A COPY OF THE AFTER VISIT SUMMARY  WAS GIVEN TO SON. THEY BOTH VERBALIZED UNDERSTANDING AND WILL CONTACT HER PRIMARY REGARDING THIS.

## 2020-07-12 NOTE — Addendum Note (Signed)
Addended by: Juanetta Beets on: 07/12/2020 10:12 AM   Modules accepted: Orders

## 2020-07-19 ENCOUNTER — Other Ambulatory Visit: Payer: Self-pay

## 2020-07-19 ENCOUNTER — Inpatient Hospital Stay: Payer: Medicare Other | Attending: Hematology and Oncology

## 2020-07-19 VITALS — BP 149/67 | HR 96 | Temp 98.2°F | Resp 20 | Ht <= 58 in | Wt 92.1 lb

## 2020-07-19 DIAGNOSIS — D509 Iron deficiency anemia, unspecified: Secondary | ICD-10-CM

## 2020-07-19 DIAGNOSIS — C3412 Malignant neoplasm of upper lobe, left bronchus or lung: Secondary | ICD-10-CM | POA: Insufficient documentation

## 2020-07-19 MED ORDER — SODIUM CHLORIDE 0.9 % IV SOLN
510.0000 mg | Freq: Once | INTRAVENOUS | Status: AC
  Administered 2020-07-19: 510 mg via INTRAVENOUS
  Filled 2020-07-19: qty 510

## 2020-07-19 MED ORDER — SODIUM CHLORIDE 0.9 % IV SOLN
Freq: Once | INTRAVENOUS | Status: AC
  Filled 2020-07-19: qty 250

## 2020-07-19 NOTE — Patient Instructions (Signed)
Ferumoxytol injection What is this medicine? FERUMOXYTOL is an iron complex. Iron is used to make healthy red blood cells, which carry oxygen and nutrients throughout the body. This medicine is used to treat iron deficiency anemia. This medicine may be used for other purposes; ask your health care provider or pharmacist if you have questions. COMMON BRAND NAME(S): Feraheme What should I tell my health care provider before I take this medicine? They need to know if you have any of these conditions:  anemia not caused by low iron levels  high levels of iron in the blood  magnetic resonance imaging (MRI) test scheduled  an unusual or allergic reaction to iron, other medicines, foods, dyes, or preservatives  pregnant or trying to get pregnant  breast-feeding How should I use this medicine? This medicine is for injection into a vein. It is given by a health care professional in a hospital or clinic setting. Talk to your pediatrician regarding the use of this medicine in children. Special care may be needed. Overdosage: If you think you have taken too much of this medicine contact a poison control center or emergency room at once. NOTE: This medicine is only for you. Do not share this medicine with others. What if I miss a dose? It is important not to miss your dose. Call your doctor or health care professional if you are unable to keep an appointment. What may interact with this medicine? This medicine may interact with the following medications:  other iron products This list may not describe all possible interactions. Give your health care provider a list of all the medicines, herbs, non-prescription drugs, or dietary supplements you use. Also tell them if you smoke, drink alcohol, or use illegal drugs. Some items may interact with your medicine. What should I watch for while using this medicine? Visit your doctor or healthcare professional regularly. Tell your doctor or healthcare  professional if your symptoms do not start to get better or if they get worse. You may need blood work done while you are taking this medicine. You may need to follow a special diet. Talk to your doctor. Foods that contain iron include: whole grains/cereals, dried fruits, beans, or peas, leafy green vegetables, and organ meats (liver, kidney). What side effects may I notice from receiving this medicine? Side effects that you should report to your doctor or health care professional as soon as possible:  allergic reactions like skin rash, itching or hives, swelling of the face, lips, or tongue  breathing problems  changes in blood pressure  feeling faint or lightheaded, falls  fever or chills  flushing, sweating, or hot feelings  swelling of the ankles or feet Side effects that usually do not require medical attention (report to your doctor or health care professional if they continue or are bothersome):  diarrhea  headache  nausea, vomiting  stomach pain This list may not describe all possible side effects. Call your doctor for medical advice about side effects. You may report side effects to FDA at 1-800-FDA-1088. Where should I keep my medicine? This drug is given in a hospital or clinic and will not be stored at home. NOTE: This sheet is a summary. It may not cover all possible information. If you have questions about this medicine, talk to your doctor, pharmacist, or health care provider.  2021 Elsevier/Gold Standard (2016-05-20 20:21:10)  

## 2020-07-19 NOTE — Progress Notes (Signed)
Pt d/c stable at 1040

## 2020-08-10 ENCOUNTER — Other Ambulatory Visit: Payer: Self-pay | Admitting: Hematology and Oncology

## 2020-08-10 MED ORDER — OXYCODONE HCL 10 MG PO TABS: 10.0000 mg | ORAL_TABLET | ORAL | 0 refills | Status: DC | PRN

## 2020-08-31 NOTE — Progress Notes (Signed)
[facility]  [address] [place],  [place]  [address] [phone]  Clinic Day:  09/04/2020  This document serves as a record of services personally performed by Marice Potter, MD. It was created on their behalf by Curry,Lauren E, a trained medical scribe. The creation of this record is based on the scribe's personal observations and the provider's statements to them.  HISTORY OF PRESENT ILLNESS:  The patient is a 80 y.o. 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  An initial lung biopsy was very suspicious, but not definitive, for squamous cell lung cancer.  She also had 3750 cGy of conventional radiation to her left hilum in 2020.   She also underwent stereotactic radiation to this region in early March 2021. As recent scans showed disease progression in this same area, she recently completed another 3000 cGy to her left hilum in April 2022.  She comes in today for routine follow-up.  Since her last visit, she has been doing okay.  She denies having any new respiratory symptoms which concern her for disease progression.  Recent labs also showed iron deficiency anemia, for which she was given IV Feraheme.   She continues to deny having any overt forms of blood loss.   PHYSICAL EXAM:  Blood pressure 128/60, pulse 86, temperature 98.8 F (37.1 C), resp. rate 16, height 4\' 10"  (1.473 m), weight 98 lb 1.6 oz (44.5 kg), SpO2 92 %. Wt Readings from Last 3 Encounters:  09/04/20 98 lb 1.6 oz (44.5 kg)  07/19/20 92 lb 1.3 oz (41.8 kg)  07/12/20 94 lb 12 oz (43 kg)   Body mass index is 20.5 kg/m. Performance status (ECOG): 3 Physical Exam Constitutional:      Appearance: Normal appearance. She is not ill-appearing.  HENT:     Mouth/Throat:     Mouth: Mucous membranes are moist.     Pharynx: Oropharynx is clear. No oropharyngeal exudate or posterior oropharyngeal erythema.  Cardiovascular:     Rate and Rhythm: Normal rate and  regular rhythm.     Heart sounds: No murmur heard. No friction rub. No gallop.   Pulmonary:     Effort: Pulmonary effort is normal. No respiratory distress.     Breath sounds: No wheezing, rhonchi or rales.     Comments: Decreased breath sounds bilaterally Chest:  Breasts:     Right: No axillary adenopathy or supraclavicular adenopathy.     Left: No axillary adenopathy or supraclavicular adenopathy.    Abdominal:     General: Bowel sounds are normal. There is no distension.     Palpations: Abdomen is soft. There is no mass.     Tenderness: There is no abdominal tenderness.  Musculoskeletal:        General: No swelling.     Right lower leg: No edema.     Left lower leg: No edema.  Lymphadenopathy:     Cervical: No cervical adenopathy.     Upper Body:     Right upper body: No supraclavicular or axillary adenopathy.     Left upper body: No supraclavicular or axillary adenopathy.     Lower Body: No right inguinal adenopathy. No left inguinal adenopathy.  Skin:    General: Skin is warm.     Coloration: Skin is not jaundiced.     Findings: No lesion or rash.  Neurological:     General: No focal deficit present.     Mental Status: She is  alert and oriented to person, place, and time. Mental status is at baseline.     Cranial Nerves: Cranial nerves are intact.  Psychiatric:        Mood and Affect: Mood normal.        Behavior: Behavior normal.        Thought Content: Thought content normal.     LABS:   CBC Latest Ref Rng & Units 09/04/2020 07/01/2020 05/13/2020  WBC - 5.7 9.1 9.2  Hemoglobin 12.0 - 16.0 10.3(A) 8.4(L) 9.5(L)  Hematocrit 36 - 46 32(A) 28.0(L) 31.5(L)  Platelets 150 - 399 362 464(H) 350   CMP Latest Ref Rng & Units 09/04/2020 07/01/2020 05/13/2020  Glucose 70 - 99 mg/dL - 97 174(H)  BUN 4 - 21 12 10 19   Creatinine 0.5 - 1.1 0.6 0.55 0.56  Sodium 137 - 147 137 139 137  Potassium 3.4 - 5.3 4.3 4.5 5.6(H)  Chloride 99 - 108 97(A) 98 98  CO2 13 - 22 37(A) 30 31   Calcium 8.7 - 10.7 9.4 9.6 9.1  Total Protein 6.5 - 8.1 g/dL - 8.1 7.4  Total Bilirubin 0.3 - 1.2 mg/dL - 0.1(L) 0.2(L)  Alkaline Phos 25 - 125 98 70 61  AST 13 - 35 22 16 14(L)  ALT 7 - 35 9 10 10    ASSESSMENT & PLAN:  Assessment/Plan:  A 80 y.o. female with stage IIB (T3 N0 M0) lung cancer, who recently completed another course of palliative  radiation to her left hilar region in April 2022.  Clinically, the patient appears to be okay.  I will repeat scans in 3 weeks to ascertain her new disease baseline.  With respect to her iron deficiency anemia, I am pleased as her hemoglobin has improved by nearly 2 grams since receiving IV Feraheme.  In passing, the patient mentioned how her appetite has fallen over the past few weeks.  I will prescribe her Megace 800 mg daily for appetite stimulation.  I will see her back in 3 weeks to review her CT scan images with her, as well as their implications.  The patient understands all the plans discussed today and is in agreement with them.     I, Rita Ohara, am acting as scribe for Marice Potter, MD    I have reviewed this report as typed by the medical scribe, and it is complete and accurate.  Dequincy Macarthur Critchley, MD

## 2020-09-03 ENCOUNTER — Other Ambulatory Visit: Payer: Self-pay | Admitting: Oncology

## 2020-09-04 ENCOUNTER — Telehealth: Payer: Self-pay | Admitting: Oncology

## 2020-09-04 ENCOUNTER — Inpatient Hospital Stay (INDEPENDENT_AMBULATORY_CARE_PROVIDER_SITE_OTHER): Payer: Medicare Other | Admitting: Oncology

## 2020-09-04 ENCOUNTER — Other Ambulatory Visit: Payer: Self-pay | Admitting: Oncology

## 2020-09-04 ENCOUNTER — Inpatient Hospital Stay: Payer: Medicare Other | Attending: Hematology and Oncology

## 2020-09-04 ENCOUNTER — Other Ambulatory Visit: Payer: Self-pay

## 2020-09-04 ENCOUNTER — Encounter: Payer: Self-pay | Admitting: Oncology

## 2020-09-04 VITALS — BP 128/60 | HR 86 | Temp 98.8°F | Resp 16 | Ht <= 58 in | Wt 98.1 lb

## 2020-09-04 DIAGNOSIS — C3412 Malignant neoplasm of upper lobe, left bronchus or lung: Secondary | ICD-10-CM

## 2020-09-04 DIAGNOSIS — D508 Other iron deficiency anemias: Secondary | ICD-10-CM | POA: Diagnosis not present

## 2020-09-04 LAB — COMPREHENSIVE METABOLIC PANEL
Albumin: 4.1 (ref 3.5–5.0)
Calcium: 9.4 (ref 8.7–10.7)

## 2020-09-04 LAB — IRON AND TIBC
Iron: 42 ug/dL (ref 28–170)
Saturation Ratios: 16 % (ref 10.4–31.8)
TIBC: 271 ug/dL (ref 250–450)
UIBC: 229 ug/dL

## 2020-09-04 LAB — BASIC METABOLIC PANEL
BUN: 12 (ref 4–21)
CO2: 37 — AB (ref 13–22)
Chloride: 97 — AB (ref 99–108)
Creatinine: 0.6 (ref 0.5–1.1)
Glucose: 106
Potassium: 4.3 (ref 3.4–5.3)
Sodium: 137 (ref 137–147)

## 2020-09-04 LAB — HEPATIC FUNCTION PANEL
ALT: 9 (ref 7–35)
AST: 22 (ref 13–35)
Alkaline Phosphatase: 98 (ref 25–125)
Bilirubin, Total: 0.2

## 2020-09-04 LAB — CBC AND DIFFERENTIAL
HCT: 32 — AB (ref 36–46)
Hemoglobin: 10.3 — AB (ref 12.0–16.0)
Neutrophils Absolute: 3.36
Platelets: 362 (ref 150–399)
WBC: 5.7

## 2020-09-04 LAB — FERRITIN: Ferritin: 688 ng/mL — ABNORMAL HIGH (ref 11–307)

## 2020-09-04 LAB — CBC: RBC: 3.9 (ref 3.87–5.11)

## 2020-09-04 MED ORDER — MEGESTROL ACETATE 400 MG/10ML PO SUSP: 800.0000 mg | Freq: Every day | ORAL | 2 refills | Status: AC

## 2020-09-04 NOTE — Telephone Encounter (Signed)
Patient scheduled for June Appt's.  Gave patient Appt Summary/Orders/Instructions

## 2020-09-25 ENCOUNTER — Inpatient Hospital Stay: Payer: Medicare Other | Attending: Hematology and Oncology | Admitting: Oncology

## 2020-09-25 ENCOUNTER — Telehealth: Payer: Self-pay | Admitting: Oncology

## 2020-09-25 ENCOUNTER — Other Ambulatory Visit: Payer: Self-pay | Admitting: Oncology

## 2020-09-25 ENCOUNTER — Encounter: Payer: Self-pay | Admitting: Oncology

## 2020-09-25 ENCOUNTER — Inpatient Hospital Stay: Payer: Medicare Other | Admitting: Oncology

## 2020-09-25 VITALS — BP 155/70 | HR 86 | Temp 98.5°F | Resp 16 | Ht <= 58 in | Wt 98.5 lb

## 2020-09-25 DIAGNOSIS — C3412 Malignant neoplasm of upper lobe, left bronchus or lung: Secondary | ICD-10-CM | POA: Diagnosis not present

## 2020-09-25 LAB — BASIC METABOLIC PANEL
BUN: 12 (ref 4–21)
CO2: 34 — AB (ref 13–22)
Chloride: 100 (ref 99–108)
Creatinine: 0.6 (ref 0.5–1.1)
Glucose: 102
Potassium: 4.3 (ref 3.4–5.3)
Sodium: 138 (ref 137–147)

## 2020-09-25 LAB — CBC AND DIFFERENTIAL
HCT: 28 — AB (ref 36–46)
Hemoglobin: 9.3 — AB (ref 12.0–16.0)
Neutrophils Absolute: 4.09
Platelets: 341 (ref 150–399)
WBC: 6.7

## 2020-09-25 LAB — HEPATIC FUNCTION PANEL
ALT: 11 (ref 7–35)
AST: 24 (ref 13–35)
Alkaline Phosphatase: 85 (ref 25–125)
Bilirubin, Total: 0.3

## 2020-09-25 LAB — COMPREHENSIVE METABOLIC PANEL
Albumin: 4.1 (ref 3.5–5.0)
Calcium: 9.6 (ref 8.7–10.7)

## 2020-09-25 LAB — CBC: RBC: 3.44 — AB (ref 3.87–5.11)

## 2020-09-25 MED ORDER — OXYCODONE HCL 10 MG PO TABS: 10.0000 mg | ORAL_TABLET | Freq: Four times a day (QID) | ORAL | 0 refills | Status: DC | PRN

## 2020-09-25 NOTE — Progress Notes (Signed)
[facility]  [address] [place],  [place]  [address] [phone]  Clinic Day:  09/25/2020  This document serves as a record of services personally performed by Marice Potter, MD. It was created on their behalf by Curry,Lauren E, a trained medical scribe. The creation of this record is based on the scribe's personal observations and the provider's statements to them.  HISTORY OF PRESENT ILLNESS:  The patient is a 80 y.o. 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  An initial lung biopsy was very suspicious, but not definitive, for squamous cell lung cancer.  She also had 3750 cGy of conventional radiation to her left hilum in 2020.   She also underwent stereotactic radiation to this region in early March 2021. As recent scans showed disease progression in this same area, she recently completed another 3000 cGy to her left hilum in April 2022.  She comes in today to review CT scan images, as well as their implications.  Since her last visit, she has been doing okay.  She denies having any new respiratory symptoms which concern her for disease progression.    PHYSICAL EXAM:  Blood pressure (!) 155/70, pulse 86, temperature 98.5 F (36.9 C), resp. rate 16, height 4\' 10"  (1.473 m), weight 98 lb 8 oz (44.7 kg), SpO2 100 %. Wt Readings from Last 3 Encounters:  09/25/20 98 lb 8 oz (44.7 kg)  09/04/20 98 lb 1.6 oz (44.5 kg)  07/19/20 92 lb 1.3 oz (41.8 kg)   Body mass index is 20.59 kg/m. Performance status (ECOG): 3 Physical Exam Constitutional:      Appearance: Normal appearance. She is not ill-appearing.  HENT:     Mouth/Throat:     Mouth: Mucous membranes are moist.     Pharynx: Oropharynx is clear. No oropharyngeal exudate or posterior oropharyngeal erythema.  Cardiovascular:     Rate and Rhythm: Normal rate and regular rhythm.     Heart sounds: No murmur heard.   No friction rub. No gallop.  Pulmonary:     Effort:  Pulmonary effort is normal. No respiratory distress.     Breath sounds: No wheezing, rhonchi or rales.     Comments: Decreased breath sounds bilaterally Chest:  Breasts:    Right: No axillary adenopathy or supraclavicular adenopathy.     Left: No axillary adenopathy or supraclavicular adenopathy.  Abdominal:     General: Bowel sounds are normal. There is no distension.     Palpations: Abdomen is soft. There is no mass.     Tenderness: There is no abdominal tenderness.  Musculoskeletal:        General: No swelling.     Right lower leg: No edema.     Left lower leg: No edema.  Lymphadenopathy:     Cervical: No cervical adenopathy.     Upper Body:     Right upper body: No supraclavicular or axillary adenopathy.     Left upper body: No supraclavicular or axillary adenopathy.     Lower Body: No right inguinal adenopathy. No left inguinal adenopathy.  Skin:    General: Skin is warm.     Coloration: Skin is not jaundiced.     Findings: No lesion or rash.  Neurological:     General: No focal deficit present.     Mental Status: She is alert and oriented to person, place, and time. Mental status is at baseline.     Cranial Nerves: Cranial nerves  are intact.  Psychiatric:        Mood and Affect: Mood normal.        Behavior: Behavior normal.        Thought Content: Thought content normal.   SCAN RESULTS:  Recent CT chest imaging has revealed the following:  FINDINGS: Cardiovascular: Heart size is within normal limits. Aortic atherosclerosis. No pericardial effusion. Coronary artery calcifications.  Mediastinum/Nodes: Normal appearance of the thyroid gland. The trachea appears patent and is midline. Normal appearance of the esophagus. No supraclavicular or axillary adenopathy. No mediastinal or hilar adenopathy.  Lungs/Pleura: Diffuse changes of emphysema. No pleural effusion. Subsegmental atelectasis and volume loss within the lingula. Centrally necrotic left upper lobe perihilar  lung mass is increased in size from previous exam measuring 5.4 x 5.4 by 3.5 cm, image 51/2. on 10/06/2020 this measured 2.9 x 2.8 by 3.3 cm. Tumor now extends to the left lateral chest wall.  Right middle lobe lung nodule measures 0.7 cm, image 77/4. Previously 0.8 cm.  There are 2 focal areas of pleural nodularity overlying the anterior left upper lobe:  -Index pleural nodule measure 1.5 x 0.8 cm, image 60/2. Previously 1.5 x 0.6 cm.  -Adjacent pleural nodule measures 1.8 x 0.8 cm, image 61/2. Previously this measured the same.  Upper Abdomen: No acute abnormality. Right kidney hypodensity measures 6 mm and is technically too small to characterize.  Musculoskeletal: No chest wall abnormality. No acute or significant osseous findings.  IMPRESSION: 1. Interval increase in size of centrally necrotic left upper lobe perihilar lung mass. Tumor now extends to the left lateral chest wall. 2. Stable right middle lobe lung nodule. 3. Two focal areas of pleural nodularity overlying the anterior left upper lobe are again noted. Stable from previous exam. 4. Emphysema and aortic atherosclerosis. Coronary artery calcifications noted.  Aortic Atherosclerosis (ICD10-I70.0) and Emphysema (ICD10-J43.9).  LABS:   CBC Latest Ref Rng & Units 09/25/2020 09/04/2020 07/01/2020  WBC - 6.7 5.7 9.1  Hemoglobin 12.0 - 16.0 9.3(A) 10.3(A) 8.4(L)  Hematocrit 36 - 46 28(A) 32(A) 28.0(L)  Platelets 150 - 399 341 362 464(H)   CMP Latest Ref Rng & Units 09/25/2020 09/04/2020 07/01/2020  Glucose 70 - 99 mg/dL - - 97  BUN 4 - 21 12 12 10   Creatinine 0.5 - 1.1 0.6 0.6 0.55  Sodium 137 - 147 138 137 139  Potassium 3.4 - 5.3 4.3 4.3 4.5  Chloride 99 - 108 100 97(A) 98  CO2 13 - 22 34(A) 37(A) 30  Calcium 8.7 - 10.7 9.6 9.4 9.6  Total Protein 6.5 - 8.1 g/dL - - 8.1  Total Bilirubin 0.3 - 1.2 mg/dL - - 0.1(L)  Alkaline Phos 25 - 125 85 98 70  AST 13 - 35 24 22 16   ALT 7 - 35 11 9 10    ASSESSMENT &  PLAN:  Assessment/Plan:  A 80 y.o. female with evidence of local disease progression of her stage IIB (T3 N0 M0) lung cancer.  As mentioned previously, she completed another course of palliative radiation to her left hilar region in April 2022.  In clinic today, I went over her CT scan images with her, which clearly show there is disease progression in her left lung.  The patient understands she can no longer rely upon radiation for disease control, particularly as her disease is getting worse over time.  I do believe she needs to be considered for some form of systemic therapy, such as either immunotherapy or targeted therapy.  However, before that can be done, she needs to have a lung biopsy from which a definitive diagnosis can be made and testing be done on it to determine if some form of systemic therapy, outside of chemotherapy, can be considered next for her palliative disease management.  I will see her back in 4 weeks for repeat clinical assessment.  The patient understands all the plans discussed today and is in agreement with them.     I, Rita Ohara, am acting as scribe for Marice Potter, MD    I have reviewed this report as typed by the medical scribe, and it is complete and accurate.  Dequincy Macarthur Critchley, MD

## 2020-09-25 NOTE — Telephone Encounter (Signed)
Per 6/13 LOS, patient scheduled for July Appt's.  Gave patient Appt Summary

## 2020-09-27 ENCOUNTER — Other Ambulatory Visit (HOSPITAL_COMMUNITY): Payer: Self-pay | Admitting: Oncology

## 2020-09-27 DIAGNOSIS — C3412 Malignant neoplasm of upper lobe, left bronchus or lung: Secondary | ICD-10-CM

## 2020-09-28 ENCOUNTER — Encounter (HOSPITAL_COMMUNITY): Payer: Self-pay

## 2020-09-28 NOTE — Progress Notes (Unsigned)
       Patient Demographics  Patient Name  [name], [name] Legal Sex  Female DOB  [birth year] SSN  [id number] Address  [place]  [place] [address] Phone  [phone] [name])  [phone] (Mobile) *Preferred*     RE: Biopsy Received: Donna Dredge, MD  Donna Cross, [place]  Talked to [name] at cancer center  They will get bronch biopsy   DDH         Previous Messages    ----- Message -----  From: Donna Cross  Sent: 09/27/2020   3:40 PM EDT  To: Ir Procedure Requests  Subject: Biopsy                                         Procedure Requested:  CT Biopsy of lung    Reason for Procedure: Malignant neoplasm of left lung    Provider Requesting:  [name]  Provider Telephone: [phone]   Other Info:

## 2020-10-02 ENCOUNTER — Encounter: Payer: Self-pay | Admitting: Oncology

## 2020-10-22 NOTE — Progress Notes (Signed)
[facility]  [address] [place],  [place]  [address] [phone]  Clinic Day:  09/25/2020  This document serves as a record of services personally performed by Marice Potter, MD. It was created on their behalf by Curry,Lauren E, a trained medical scribe. The creation of this record is based on the scribe's personal observations and the provider's statements to them.  HISTORY OF PRESENT ILLNESS:  The patient is a 80 y.o. 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  An initial lung biopsy was very suspicious, but not definitive, for squamous cell lung cancer.  She also had 3750 cGy of conventional radiation to her left hilum in 2020.   She also underwent stereotactic radiation to this region in early March 2021. As recent scans showed disease progression in this same area, she recently completed another 3000 cGy to her left hilum in April 2022.  Unfortunately, CT scans done recently showed evidence of further local disease progression.  She comes in today to discuss what next to consider for her disease management.  Since her last visit, she believes she has gotten weaker.  She can barely walk 10 feet before becoming extremely winded.  She is becoming more reliant upon family for her activities of daily living.  PHYSICAL EXAM:  Blood pressure (!) 170/74, pulse 80, temperature 99.3 F (37.4 C), resp. rate 16, height 4\' 10"  (1.473 m), weight 97 lb 6.4 oz (44.2 kg), SpO2 97 %. Wt Readings from Last 3 Encounters:  10/23/20 97 lb 6.4 oz (44.2 kg)  09/25/20 98 lb 8 oz (44.7 kg)  09/04/20 98 lb 1.6 oz (44.5 kg)   Body mass index is 20.36 kg/m. Performance status (ECOG): 3 Physical Exam Constitutional:      Appearance: She is ill-appearing.     Comments: She is in a wheelchair;  she is wearing oxygen per nasal canula; she has a chronically ill appearance.  HENT:     Mouth/Throat:     Mouth: Mucous membranes are moist.      Pharynx: Oropharynx is clear. No oropharyngeal exudate or posterior oropharyngeal erythema.  Cardiovascular:     Rate and Rhythm: Normal rate and regular rhythm.     Heart sounds: No murmur heard.   No friction rub. No gallop.  Pulmonary:     Effort: Pulmonary effort is normal. No respiratory distress.     Breath sounds: No wheezing, rhonchi or rales.     Comments: Decreased breath sounds bilaterally Chest:  Breasts:    Right: No axillary adenopathy or supraclavicular adenopathy.     Left: No axillary adenopathy or supraclavicular adenopathy.  Abdominal:     General: Bowel sounds are normal. There is no distension.     Palpations: Abdomen is soft. There is no mass.     Tenderness: There is no abdominal tenderness.  Musculoskeletal:        General: No swelling.     Right lower leg: No edema.     Left lower leg: No edema.  Lymphadenopathy:     Cervical: No cervical adenopathy.     Upper Body:     Right upper body: No supraclavicular or axillary adenopathy.     Left upper body: No supraclavicular or axillary adenopathy.     Lower Body: No right inguinal adenopathy. No left inguinal adenopathy.  Skin:    General: Skin is warm.     Coloration: Skin is not jaundiced.     Findings:  No lesion or rash.  Neurological:     General: No focal deficit present.     Mental Status: She is alert and oriented to person, place, and time. Mental status is at baseline.     Cranial Nerves: Cranial nerves are intact.  Psychiatric:        Mood and Affect: Mood normal.        Behavior: Behavior normal.        Thought Content: Thought content normal.    LABS:   CBC Latest Ref Rng & Units 10/23/2020 09/25/2020 09/04/2020  WBC - 7.5 6.7 5.7  Hemoglobin 12.0 - 16.0 8.5(A) 9.3(A) 10.3(A)  Hematocrit 36 - 46 26(A) 28(A) 32(A)  Platelets 150 - 399 491(A) 341 362   CMP Latest Ref Rng & Units 10/23/2020 09/25/2020 09/04/2020  Glucose 70 - 99 mg/dL - - -  BUN 4 - 21 14 12 12   Creatinine 0.5 - 1.1 0.5 0.6  0.6  Sodium 137 - 147 140 138 137  Potassium 3.4 - 5.3 4.8 4.3 4.3  Chloride 99 - 108 101 100 97(A)  CO2 13 - 22 28(A) 34(A) 37(A)  Calcium 8.7 - 10.7 9.6 9.6 9.4  Total Protein 6.5 - 8.1 g/dL - - -  Total Bilirubin 0.3 - 1.2 mg/dL - - -  Alkaline Phos 25 - 125 74 85 98  AST 13 - 35 21 24 22   ALT 7 - 35 9 11 9     Ref. Range 10/23/2020 10:40  Iron Latest Ref Range: 28 - 170 ug/dL 36  UIBC Latest Units: ug/dL 255  TIBC Latest Ref Range: 250 - 450 ug/dL 291  Saturation Ratios Latest Ref Range: 10.4 - 31.8 % 12  Ferritin Latest Ref Range: 11 - 307 ng/mL 653 (H)   ASSESSMENT & PLAN:  Assessment/Plan:  A 80 y.o. female with evidence of local disease progression of her stage IIB (T3 N0 M0) lung cancer.  Clinically, the patient appears as if she is declining.  I will get care connections to assess her and determine if there are any additional services she can receive to benefit her daily quality of life.  I did discuss potentially enrolling her into hospice as I am becoming more concerned she is eligible for it.  Her life expectancy may not extend beyond these next 6 months. The patient has an ECOG 3 performance status.  I will no longer discuss palliative chemotherapy.  Per her request, I will have her peripheral blood sent to [facility] as a liquid biopsy to determine if her disease has a particular alteration for which targeted therapy could be considered.  When evaluating her labs today, she has become progressively anemic.  Her iron parameters are not consistent with iron deficiency anemia.  Overall, the patient understands I am very concerned about her declining clinical status.  I will tentatively see her back in 2 months for repeat clinical assessment.  The patient understands all the plans discussed today and is in agreement with them.     I, Rita Ohara, am acting as scribe for Marice Potter, MD    I have reviewed this report as typed by the medical scribe, and it is complete and  accurate.  Zayden Hahne Macarthur Critchley, MD

## 2020-10-23 ENCOUNTER — Inpatient Hospital Stay (INDEPENDENT_AMBULATORY_CARE_PROVIDER_SITE_OTHER): Payer: Medicare Other | Admitting: Oncology

## 2020-10-23 ENCOUNTER — Other Ambulatory Visit: Payer: Self-pay

## 2020-10-23 ENCOUNTER — Telehealth: Payer: Self-pay | Admitting: Oncology

## 2020-10-23 ENCOUNTER — Inpatient Hospital Stay: Payer: Medicare Other

## 2020-10-23 ENCOUNTER — Other Ambulatory Visit: Payer: Self-pay | Admitting: Oncology

## 2020-10-23 ENCOUNTER — Other Ambulatory Visit: Payer: Self-pay | Admitting: Hematology and Oncology

## 2020-10-23 ENCOUNTER — Inpatient Hospital Stay: Payer: Medicare Other | Attending: Oncology

## 2020-10-23 VITALS — BP 170/74 | HR 80 | Temp 99.3°F | Resp 16 | Ht <= 58 in | Wt 97.4 lb

## 2020-10-23 DIAGNOSIS — D508 Other iron deficiency anemias: Secondary | ICD-10-CM

## 2020-10-23 DIAGNOSIS — D649 Anemia, unspecified: Secondary | ICD-10-CM | POA: Diagnosis not present

## 2020-10-23 DIAGNOSIS — C3412 Malignant neoplasm of upper lobe, left bronchus or lung: Secondary | ICD-10-CM

## 2020-10-23 DIAGNOSIS — Z79899 Other long term (current) drug therapy: Secondary | ICD-10-CM | POA: Diagnosis not present

## 2020-10-23 LAB — IRON AND TIBC
Iron: 36 ug/dL (ref 28–170)
Saturation Ratios: 12 % (ref 10.4–31.8)
TIBC: 291 ug/dL (ref 250–450)
UIBC: 255 ug/dL

## 2020-10-23 LAB — FERRITIN: Ferritin: 653 ng/mL — ABNORMAL HIGH (ref 11–307)

## 2020-10-23 LAB — CBC
MCV: 83 (ref 81–99)
RBC: 3.16 — AB (ref 3.87–5.11)

## 2020-10-23 LAB — BASIC METABOLIC PANEL
BUN: 14 (ref 4–21)
CO2: 28 — AB (ref 13–22)
Chloride: 101 (ref 99–108)
Creatinine: 0.5 (ref 0.5–1.1)
Glucose: 96
Potassium: 4.8 (ref 3.4–5.3)
Sodium: 140 (ref 137–147)

## 2020-10-23 LAB — COMPREHENSIVE METABOLIC PANEL
Albumin: 3.6 (ref 3.5–5.0)
Calcium: 9.6 (ref 8.7–10.7)

## 2020-10-23 LAB — HEPATIC FUNCTION PANEL
ALT: 9 (ref 7–35)
AST: 21 (ref 13–35)
Alkaline Phosphatase: 74 (ref 25–125)
Bilirubin, Total: 0.3

## 2020-10-23 LAB — CBC AND DIFFERENTIAL
HCT: 26 — AB (ref 36–46)
Hemoglobin: 8.5 — AB (ref 12.0–16.0)
Neutrophils Absolute: 5.25
Platelets: 491 — AB (ref 150–399)
WBC: 7.5

## 2020-10-23 MED ORDER — OXYCODONE HCL 10 MG PO TABS: 10.0000 mg | ORAL_TABLET | Freq: Four times a day (QID) | ORAL | 0 refills | Status: DC | PRN

## 2020-10-23 NOTE — Telephone Encounter (Signed)
Per 7/11 LOS, patient scheduled for Sept Appt's.  Gave patient Appt Summary

## 2020-10-24 ENCOUNTER — Encounter: Payer: Self-pay | Admitting: Oncology

## 2020-11-23 ENCOUNTER — Other Ambulatory Visit: Payer: Self-pay | Admitting: Oncology

## 2020-11-23 MED ORDER — OXYCODONE HCL 15 MG PO TABS: 15.0000 mg | ORAL_TABLET | Freq: Four times a day (QID) | ORAL | 0 refills | Status: DC | PRN

## 2020-12-15 NOTE — Progress Notes (Signed)
[facility]  [address] [place],  [place]  [address] [phone]  Clinic Day:  12/25/2020  This document serves as a record of services personally performed by Marice Potter, MD. It was created on their behalf by Curry,Lauren E, a trained medical scribe. The creation of this record is based on the scribe's personal observations and the provider's statements to them.  HISTORY OF PRESENT ILLNESS:  The patient is a 80 y.o. 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  An initial lung biopsy was very suspicious, but not definitive, for squamous cell lung cancer.  She also had 3750 cGy of conventional radiation to her left hilum in 2020.   She also underwent stereotactic radiation to this region in early March 2021. As recent scans showed disease progression in this same area, she recently completed another 3000 cGy to her left hilum in April 2022.  Unfortunately, CT scans done showed evidence of further local disease progression.  She comes in today for repeat clinical assessment.  Since her last visit, she has gotten weaker.  She is becoming more reliant upon family for her activities of daily living.  PHYSICAL EXAM:  Blood pressure (!) 111/53, pulse 86, temperature 98.1 F (36.7 C), resp. rate 18, height 4\' 10"  (1.473 m), weight 91 lb 11.2 oz (41.6 kg), SpO2 100 %. Wt Readings from Last 3 Encounters:  12/25/20 91 lb 11.2 oz (41.6 kg)  10/23/20 97 lb 6.4 oz (44.2 kg)  09/25/20 98 lb 8 oz (44.7 kg)   Body mass index is 19.17 kg/m. Performance status (ECOG): 3 Physical Exam Constitutional:      General: She is not in acute distress.    Appearance: Normal appearance. She is normal weight. She is ill-appearing.     Comments: She is in a wheelchair;  she is wearing oxygen per nasal canula; she has a chronically ill appearance.  HENT:     Head: Normocephalic and atraumatic.     Mouth/Throat:     Mouth: Mucous membranes are  moist.     Pharynx: Oropharynx is clear. No oropharyngeal exudate or posterior oropharyngeal erythema.  Eyes:     General: No scleral icterus.    Extraocular Movements: Extraocular movements intact.     Conjunctiva/sclera: Conjunctivae normal.     Pupils: Pupils are equal, round, and reactive to light.  Cardiovascular:     Rate and Rhythm: Normal rate and regular rhythm.     Pulses: Normal pulses.     Heart sounds: Normal heart sounds. No murmur heard.   No friction rub. No gallop.  Pulmonary:     Effort: Pulmonary effort is normal. No respiratory distress.     Breath sounds: Normal breath sounds. No wheezing, rhonchi or rales.     Comments: Decreased breath sounds bilaterally Abdominal:     General: Bowel sounds are normal. There is no distension.     Palpations: Abdomen is soft. There is no hepatomegaly, splenomegaly or mass.     Tenderness: There is no abdominal tenderness.  Musculoskeletal:        General: No swelling. Normal range of motion.     Cervical back: Normal range of motion and neck supple.     Right lower leg: No edema.     Left lower leg: No edema.  Lymphadenopathy:     Cervical: No cervical adenopathy.     Upper Body:     Right upper body: No supraclavicular or axillary  adenopathy.     Left upper body: No supraclavicular or axillary adenopathy.     Lower Body: No right inguinal adenopathy. No left inguinal adenopathy.  Skin:    General: Skin is warm and dry.     Coloration: Skin is not jaundiced.     Findings: No lesion or rash.  Neurological:     General: No focal deficit present.     Mental Status: She is alert and oriented to person, place, and time. Mental status is at baseline.     Cranial Nerves: Cranial nerves are intact.  Psychiatric:        Mood and Affect: Mood normal.        Behavior: Behavior normal.        Thought Content: Thought content normal.        Judgment: Judgment normal.    LABS:     Ref. Range 12/25/2020 09:31  Iron Latest Ref  Range: 28 - 170 ug/dL 12 (L)  UIBC Latest Units: ug/dL 208  TIBC Latest Ref Range: 250 - 450 ug/dL 220 (L)  Saturation Ratios Latest Ref Range: 10.4 - 31.8 % 5 (L)  Ferritin Latest Ref Range: 11 - 307 ng/mL 395 (H)   CMP Latest Ref Rng & Units 12/25/2020 10/23/2020 09/25/2020  Glucose 70 - 99 mg/dL - - -  BUN 4 - 21 12 14 12   Creatinine 0.5 - 1.1 0.6 0.5 0.6  Sodium 137 - 147 138 140 138  Potassium 3.4 - 5.3 4.3 4.8 4.3  Chloride 99 - 108 94(A) 101 100  CO2 13 - 22 39(A) 28(A) 34(A)  Calcium 8.7 - 10.7 9.7 9.6 9.6  Total Protein 6.5 - 8.1 g/dL - - -  Total Bilirubin 0.3 - 1.2 mg/dL - - -  Alkaline Phos 25 - 125 82 74 85  AST 13 - 35 25 21 24   ALT 7 - 35 8 9 11     ASSESSMENT & PLAN:  Assessment/Plan:  A 80 y.o. female with evidence of local disease progression of her stage IIB (T3 N0 M0) lung cancer.  However, the more pressing issue is her severe anemia.  The abrupt decline in her hemoglobin and MCV, in conjunction with her elevated platelets, is consistent with recurrent iron deficiency.  I will arrange for her to receive IV Feraheme 1020 mg over the next few weeks.  Furthermore, I will arrange for her to be transfused 2 units of packed red cells tomorrow morning.  As it pertains to his cancer, [employer] testing did not show her to have any actionable mutation for which targeted therapy could be applied.  Of note, hospice is seeing her every 2 weeks.  She is clearly declining; I will arrange for them to see her more frequently.  I will see her back in 1 month for repeat clinical assessment.  The patient understands all the plans discussed today and is in agreement with them.     I, Rita Ohara, am acting as scribe for Marice Potter, MD    I have reviewed this report as typed by the medical scribe, and it is complete and accurate.  Dequincy Macarthur Critchley, MD

## 2020-12-25 ENCOUNTER — Other Ambulatory Visit: Payer: Self-pay

## 2020-12-25 ENCOUNTER — Telehealth: Payer: Self-pay | Admitting: Oncology

## 2020-12-25 ENCOUNTER — Inpatient Hospital Stay (INDEPENDENT_AMBULATORY_CARE_PROVIDER_SITE_OTHER): Payer: Medicare Other | Admitting: Oncology

## 2020-12-25 ENCOUNTER — Other Ambulatory Visit: Payer: Self-pay | Admitting: Hematology and Oncology

## 2020-12-25 ENCOUNTER — Inpatient Hospital Stay: Payer: Medicare Other

## 2020-12-25 ENCOUNTER — Encounter: Payer: Self-pay | Admitting: Oncology

## 2020-12-25 ENCOUNTER — Telehealth: Payer: Self-pay

## 2020-12-25 ENCOUNTER — Other Ambulatory Visit: Payer: Self-pay | Admitting: Oncology

## 2020-12-25 ENCOUNTER — Inpatient Hospital Stay: Payer: Medicare Other | Attending: Oncology

## 2020-12-25 VITALS — BP 111/53 | HR 86 | Temp 98.1°F | Resp 18 | Ht <= 58 in | Wt 91.7 lb

## 2020-12-25 VITALS — BP 131/61 | HR 80 | Temp 98.4°F | Resp 18 | Ht <= 58 in | Wt 91.0 lb

## 2020-12-25 DIAGNOSIS — Z79899 Other long term (current) drug therapy: Secondary | ICD-10-CM | POA: Insufficient documentation

## 2020-12-25 DIAGNOSIS — D649 Anemia, unspecified: Secondary | ICD-10-CM | POA: Insufficient documentation

## 2020-12-25 DIAGNOSIS — C3412 Malignant neoplasm of upper lobe, left bronchus or lung: Secondary | ICD-10-CM | POA: Diagnosis not present

## 2020-12-25 DIAGNOSIS — D508 Other iron deficiency anemias: Secondary | ICD-10-CM

## 2020-12-25 DIAGNOSIS — D509 Iron deficiency anemia, unspecified: Secondary | ICD-10-CM

## 2020-12-25 LAB — BASIC METABOLIC PANEL
BUN: 12 (ref 4–21)
CO2: 39 — AB (ref 13–22)
Chloride: 94 — AB (ref 99–108)
Creatinine: 0.6 (ref 0.5–1.1)
Glucose: 127
Potassium: 4.3 (ref 3.4–5.3)
Sodium: 138 (ref 137–147)

## 2020-12-25 LAB — CBC AND DIFFERENTIAL
HCT: 21 — AB (ref 36–46)
Hemoglobin: 6.4 — AB (ref 12.0–16.0)
Neutrophils Absolute: 6.44
Platelets: 571 — AB (ref 150–399)
WBC: 8.7

## 2020-12-25 LAB — COMPREHENSIVE METABOLIC PANEL
Albumin: 3.5 (ref 3.5–5.0)
Calcium: 9.7 (ref 8.7–10.7)

## 2020-12-25 LAB — FERRITIN: Ferritin: 395 ng/mL — ABNORMAL HIGH (ref 11–307)

## 2020-12-25 LAB — HEPATIC FUNCTION PANEL
ALT: 8 (ref 7–35)
AST: 25 (ref 13–35)
Alkaline Phosphatase: 82 (ref 25–125)
Bilirubin, Total: 0.1

## 2020-12-25 LAB — IRON AND TIBC
Iron: 12 ug/dL — ABNORMAL LOW (ref 28–170)
Saturation Ratios: 5 % — ABNORMAL LOW (ref 10.4–31.8)
TIBC: 220 ug/dL — ABNORMAL LOW (ref 250–450)
UIBC: 208 ug/dL

## 2020-12-25 LAB — CBC
MCV: 70 — AB (ref 81–99)
RBC: 3 — AB (ref 3.87–5.11)

## 2020-12-25 MED ORDER — SODIUM CHLORIDE 0.9 % IV SOLN
510.0000 mg | Freq: Once | INTRAVENOUS | Status: AC
  Administered 2020-12-25: 510 mg via INTRAVENOUS
  Filled 2020-12-25: qty 17

## 2020-12-25 MED ORDER — SODIUM CHLORIDE 0.9 % IV SOLN: Freq: Once | INTRAVENOUS | Status: AC

## 2020-12-25 NOTE — Patient Instructions (Signed)
Ferumoxytol Injection What is this medication? FERUMOXYTOL (FER ue MOX i tol) treats low levels of iron in your body (iron deficiency anemia). Iron is a mineral that plays an important role in making red blood cells, which carry oxygen from your lungs to the rest of your body. This medicine may be used for other purposes; ask your health care provider or pharmacist if you have questions. COMMON BRAND NAME(S): Feraheme What should I tell my care team before I take this medication? They need to know if you have any of these conditions: Anemia not caused by low iron levels High levels of iron in the blood Magnetic resonance imaging (MRI) test scheduled An unusual or allergic reaction to iron, other medications, foods, dyes, or preservatives Pregnant or trying to get pregnant Breast-feeding How should I use this medication? This medication is for injection into a vein. It is given in a hospital or clinic setting. Talk to your care team the use of this medication in children. Special care may be needed. Overdosage: If you think you have taken too much of this medicine contact a poison control center or emergency room at once. NOTE: This medicine is only for you. Do not share this medicine with others. What if I miss a dose? It is important not to miss your dose. Call your care team if you are unable to keep an appointment. What may interact with this medication? Other iron products This list may not describe all possible interactions. Give your health care provider a list of all the medicines, herbs, non-prescription drugs, or dietary supplements you use. Also tell them if you smoke, drink alcohol, or use illegal drugs. Some items may interact with your medicine. What should I watch for while using this medication? Visit your care team regularly. Tell your care team if your symptoms do not start to get better or if they get worse. You may need blood work done while you are taking this  medication. You may need to follow a special diet. Talk to your care team. Foods that contain iron include: whole grains/cereals, dried fruits, beans, or peas, leafy green vegetables, and organ meats (liver, kidney). What side effects may I notice from receiving this medication? Side effects that you should report to your care team as soon as possible: Allergic reactions-skin rash, itching, hives, swelling of the face, lips, tongue, or throat Low blood pressure-dizziness, feeling faint or lightheaded, blurry vision Shortness of breath Side effects that usually do not require medical attention (report to your care team if they continue or are bothersome): Flushing Headache Joint pain Muscle pain Nausea Pain, redness, or irritation at injection site This list may not describe all possible side effects. Call your doctor for medical advice about side effects. You may report side effects to FDA at 1-800-FDA-1088. Where should I keep my medication? This medication is given in a hospital or clinic and will not be stored at home. NOTE: This sheet is a summary. It may not cover all possible information. If you have questions about this medicine, talk to your doctor, pharmacist, or health care provider.  2022 Elsevier/Gold Standard (2020-08-18 15:35:12)  

## 2020-12-25 NOTE — Telephone Encounter (Signed)
I notified Verner Chol, for Dr Bobby Rumpf via phone message that pt is not under Hospice services. Vicente Males, a nurse, for [facility] program, called to clarify the services after reading Dr Bobby Rumpf' office note that pt was receiving Hospice services. If the patient needs Hospice services, a referral will need to be made. [phone]

## 2020-12-25 NOTE — Telephone Encounter (Signed)
Per 9/12 LOS, patient scheduled for Oct Appt's.  Estill Dooms scheduled IV Iron Appt's this month  Gave patient Appt Summary

## 2020-12-27 ENCOUNTER — Other Ambulatory Visit: Payer: Self-pay | Admitting: Hematology and Oncology

## 2020-12-27 MED ORDER — OXYCODONE HCL 15 MG PO TABS: 15.0000 mg | ORAL_TABLET | Freq: Four times a day (QID) | ORAL | 0 refills | Status: DC | PRN

## 2020-12-27 MED FILL — Ferumoxytol Inj 510 MG/17ML (30 MG/ML) (Elemental Fe): INTRAVENOUS | Qty: 17 | Status: AC

## 2020-12-28 ENCOUNTER — Other Ambulatory Visit: Payer: Self-pay

## 2020-12-28 ENCOUNTER — Inpatient Hospital Stay: Payer: Medicare Other

## 2020-12-28 DIAGNOSIS — C3412 Malignant neoplasm of upper lobe, left bronchus or lung: Secondary | ICD-10-CM | POA: Diagnosis not present

## 2020-12-28 DIAGNOSIS — D509 Iron deficiency anemia, unspecified: Secondary | ICD-10-CM

## 2020-12-28 MED ORDER — SODIUM CHLORIDE 0.9 % IV SOLN: Freq: Once | INTRAVENOUS | Status: AC

## 2020-12-28 MED ORDER — FERUMOXYTOL INJECTION 510 MG/17 ML
510.0000 mg | Freq: Once | INTRAVENOUS | Status: AC
  Administered 2020-12-28: 510 mg via INTRAVENOUS
  Filled 2020-12-28: qty 17

## 2020-12-28 NOTE — Patient Instructions (Signed)
Ferumoxytol Injection What is this medication? FERUMOXYTOL (FER ue MOX i tol) treats low levels of iron in your body (iron deficiency anemia). Iron is a mineral that plays an important role in making red blood cells, which carry oxygen from your lungs to the rest of your body. This medicine may be used for other purposes; ask your health care provider or pharmacist if you have questions. COMMON BRAND NAME(S): Feraheme What should I tell my care team before I take this medication? They need to know if you have any of these conditions: Anemia not caused by low iron levels High levels of iron in the blood Magnetic resonance imaging (MRI) test scheduled An unusual or allergic reaction to iron, other medications, foods, dyes, or preservatives Pregnant or trying to get pregnant Breast-feeding How should I use this medication? This medication is for injection into a vein. It is given in a hospital or clinic setting. Talk to your care team the use of this medication in children. Special care may be needed. Overdosage: If you think you have taken too much of this medicine contact a poison control center or emergency room at once. NOTE: This medicine is only for you. Do not share this medicine with others. What if I miss a dose? It is important not to miss your dose. Call your care team if you are unable to keep an appointment. What may interact with this medication? Other iron products This list may not describe all possible interactions. Give your health care provider a list of all the medicines, herbs, non-prescription drugs, or dietary supplements you use. Also tell them if you smoke, drink alcohol, or use illegal drugs. Some items may interact with your medicine. What should I watch for while using this medication? Visit your care team regularly. Tell your care team if your symptoms do not start to get better or if they get worse. You may need blood work done while you are taking this  medication. You may need to follow a special diet. Talk to your care team. Foods that contain iron include: whole grains/cereals, dried fruits, beans, or peas, leafy green vegetables, and organ meats (liver, kidney). What side effects may I notice from receiving this medication? Side effects that you should report to your care team as soon as possible: Allergic reactions-skin rash, itching, hives, swelling of the face, lips, tongue, or throat Low blood pressure-dizziness, feeling faint or lightheaded, blurry vision Shortness of breath Side effects that usually do not require medical attention (report to your care team if they continue or are bothersome): Flushing Headache Joint pain Muscle pain Nausea Pain, redness, or irritation at injection site This list may not describe all possible side effects. Call your doctor for medical advice about side effects. You may report side effects to FDA at 1-800-FDA-1088. Where should I keep my medication? This medication is given in a hospital or clinic and will not be stored at home. NOTE: This sheet is a summary. It may not cover all possible information. If you have questions about this medicine, talk to your doctor, pharmacist, or health care provider.  2022 Elsevier/Gold Standard (2020-08-18 15:35:12)  

## 2021-01-18 NOTE — Progress Notes (Signed)
[facility]  [address] [place],  [place]  [address] [phone]  Clinic Day:  01/24/2021  This document serves as a record of services personally performed by Marice Potter, MD. It was created on their behalf by Curry,Lauren E, a trained medical scribe. The creation of this record is based on the scribe's personal observations and the provider's statements to them.  HISTORY OF PRESENT ILLNESS:  The patient is a 80 y.o. 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  Due to her progressive weakness and desire not to seek further treatment for her cancer, the patient has been under hospice care.  Despite being under hospice care, she still prefers to come into clinic to be assessed.  She claims to be doing fairly well at home.  She relies upon oxygen to assist her with breathing when she moves throughout the house.  Her pain remains under control with her oxycodone.    With respect to her lung cancer history, an initial lung biopsy was very suspicious, but not definitive, for squamous cell lung cancer.  She had 3750 cGy of conventional radiation to her left hilum in 2020.   She also underwent stereotactic radiation to this region in early March 2021. As recent scans showed disease progression in this same area, she recently completed another 3000 cGy to her left hilum in April 2022.  Unfortunately, CT scans done showed evidence of further local disease progression.  However, no further radiation could be rendered to this area.  The patient has never been interested in considering systemic therapy.    PHYSICAL EXAM:  Blood pressure 134/63, pulse 94, temperature 98.2 F (36.8 C), resp. rate 16, height 4\' 10"  (1.473 m), weight 91 lb 14.4 oz (41.7 kg), SpO2 92 %. Wt Readings from Last 3 Encounters:  01/24/21 91 lb 14.4 oz (41.7 kg)  12/25/20 91 lb (41.3 kg)  12/25/20 91 lb 11.2 oz (41.6 kg)   Body mass index is 19.21  kg/m. Performance status (ECOG): 3 Physical Exam Constitutional:      General: She is not in acute distress.    Appearance: Normal appearance. She is normal weight. She is ill-appearing.     Comments: She is in a wheelchair;  she is wearing oxygen per nasal canula; she has a chronically ill appearance.  HENT:     Head: Normocephalic and atraumatic.     Mouth/Throat:     Mouth: Mucous membranes are moist.     Pharynx: Oropharynx is clear. No oropharyngeal exudate or posterior oropharyngeal erythema.  Eyes:     General: No scleral icterus.    Extraocular Movements: Extraocular movements intact.     Conjunctiva/sclera: Conjunctivae normal.     Pupils: Pupils are equal, round, and reactive to light.  Cardiovascular:     Rate and Rhythm: Normal rate and regular rhythm.     Pulses: Normal pulses.     Heart sounds: Normal heart sounds. No murmur heard.   No friction rub. No gallop.  Pulmonary:     Effort: Pulmonary effort is normal. No respiratory distress.     Breath sounds: Normal breath sounds. No wheezing, rhonchi or rales.  Abdominal:     General: Bowel sounds are normal. There is no distension.     Palpations: Abdomen is soft. There is no hepatomegaly, splenomegaly or mass.     Tenderness: There is no abdominal tenderness.  Musculoskeletal:        General: No  swelling. Normal range of motion.     Cervical back: Normal range of motion and neck supple.     Right lower leg: No edema.     Left lower leg: No edema.  Lymphadenopathy:     Cervical: No cervical adenopathy.     Upper Body:     Right upper body: No supraclavicular or axillary adenopathy.     Left upper body: No supraclavicular or axillary adenopathy.     Lower Body: No right inguinal adenopathy. No left inguinal adenopathy.  Skin:    General: Skin is warm and dry.     Coloration: Skin is not jaundiced.     Findings: No lesion or rash.  Neurological:     General: No focal deficit present.     Mental Status: She is  alert and oriented to person, place, and time. Mental status is at baseline.     Cranial Nerves: Cranial nerves are intact.  Psychiatric:        Mood and Affect: Mood normal.        Behavior: Behavior normal.        Thought Content: Thought content normal.        Judgment: Judgment normal.    LABS:    Ref. Range 01/24/2021 00:00  Sodium Latest Ref Range: 137 - 147  136 (A)  Potassium Latest Ref Range: 3.4 - 5.3  4.5  Chloride Latest Ref Range: 99 - 108  96 (A)  CO2 Latest Ref Range: 13 - 22  38 (A)  Glucose Unknown 116  BUN Latest Ref Range: 4 - 21  9  Creatinine Latest Ref Range: 0.5 - 1.1  0.5  Calcium Latest Ref Range: 8.7 - 10.7  9.6  Alkaline Phosphatase Latest Ref Range: 25 - 125  84  Albumin Latest Ref Range: 3.5 - 5.0  3.5  AST Latest Ref Range: 13 - 35  20  ALT Latest Ref Range: 7 - 35  7  Bilirubin, Total Unknown 0.2    CMP Latest Ref Rng & Units 01/24/2021 12/25/2020 10/23/2020  Glucose 70 - 99 mg/dL - - -  BUN 4 - 21 9 12 14   Creatinine 0.5 - 1.1 0.5 0.6 0.5  Sodium 137 - 147 136(A) 138 140  Potassium 3.4 - 5.3 4.5 4.3 4.8  Chloride 99 - 108 96(A) 94(A) 101  CO2 13 - 22 38(A) 39(A) 28(A)  Calcium 8.7 - 10.7 9.6 9.7 9.6  Total Protein 6.5 - 8.1 g/dL - - -  Total Bilirubin 0.3 - 1.2 mg/dL - - -  Alkaline Phos 25 - 125 84 82 74  AST 13 - 35 20 25 21   ALT 7 - 35 7 8 9     ASSESSMENT & PLAN:  Assessment/Plan:  A 80 y.o. female with evidence of local disease progression of her stage IIB (T3 N0 M0) lung cancer. Despite having locally advanced disease and not having treatment to slow it down, she clinically appears to be doing okay.  Hospice will continue to see her to ensure her daily quality of life is being maintained as best as possible.  Per her request, I will see her back in 1 month for repeat clinical assessment.  The patient understands all the plans discussed today and is in agreement with them.     I, Rita Ohara, am acting as scribe for Marice Potter,  MD    I have reviewed this report as typed by the medical scribe, and it is complete and  accurate.  Dequincy Macarthur Critchley, MD

## 2021-01-24 ENCOUNTER — Inpatient Hospital Stay (INDEPENDENT_AMBULATORY_CARE_PROVIDER_SITE_OTHER): Payer: Medicare Other | Admitting: Oncology

## 2021-01-24 ENCOUNTER — Other Ambulatory Visit: Payer: Self-pay | Admitting: Oncology

## 2021-01-24 ENCOUNTER — Other Ambulatory Visit: Payer: Self-pay | Admitting: Hematology and Oncology

## 2021-01-24 ENCOUNTER — Inpatient Hospital Stay: Payer: Medicare Other | Attending: Oncology

## 2021-01-24 VITALS — BP 134/63 | HR 94 | Temp 98.2°F | Resp 16 | Ht <= 58 in | Wt 91.9 lb

## 2021-01-24 DIAGNOSIS — D508 Other iron deficiency anemias: Secondary | ICD-10-CM | POA: Diagnosis not present

## 2021-01-24 DIAGNOSIS — C3412 Malignant neoplasm of upper lobe, left bronchus or lung: Secondary | ICD-10-CM | POA: Diagnosis present

## 2021-01-24 LAB — IRON AND TIBC
Iron: 33 ug/dL (ref 28–170)
Saturation Ratios: 20 % (ref 10.4–31.8)
TIBC: 164 ug/dL — ABNORMAL LOW (ref 250–450)
UIBC: 131 ug/dL

## 2021-01-24 LAB — HEPATIC FUNCTION PANEL
ALT: 7 (ref 7–35)
AST: 20 (ref 13–35)
Alkaline Phosphatase: 84 (ref 25–125)
Bilirubin, Total: 0.2

## 2021-01-24 LAB — BASIC METABOLIC PANEL
BUN: 9 (ref 4–21)
CO2: 38 — AB (ref 13–22)
Chloride: 96 — AB (ref 99–108)
Creatinine: 0.5 (ref 0.5–1.1)
Glucose: 116
Potassium: 4.5 (ref 3.4–5.3)
Sodium: 136 — AB (ref 137–147)

## 2021-01-24 LAB — CBC AND DIFFERENTIAL
HCT: 29 — AB (ref 36–46)
Hemoglobin: 8.9 — AB (ref 12.0–16.0)
Neutrophils Absolute: 7.61
Platelets: 388 (ref 150–399)
WBC: 9.4

## 2021-01-24 LAB — COMPREHENSIVE METABOLIC PANEL
Albumin: 3.5 (ref 3.5–5.0)
Calcium: 9.6 (ref 8.7–10.7)

## 2021-01-24 LAB — FERRITIN: Ferritin: 4254 ng/mL — ABNORMAL HIGH (ref 11–307)

## 2021-01-24 LAB — CBC: RBC: 3.61 — AB (ref 3.87–5.11)

## 2021-01-24 MED ORDER — OXYCODONE HCL 15 MG PO TABS: 15.0000 mg | ORAL_TABLET | Freq: Four times a day (QID) | ORAL | 0 refills | Status: AC | PRN

## 2021-02-07 IMAGING — DX DG CHEST 1V PORT
1 series · 1 of 1 positions shown · non-contrast
Comparison: CT chest 05/08/2020

CLINICAL DATA: Shortness of breath for 2 weeks. Home oxygen due to
COPD. Cough.

EXAM:
PORTABLE CHEST 1 VIEW

[chest ap]
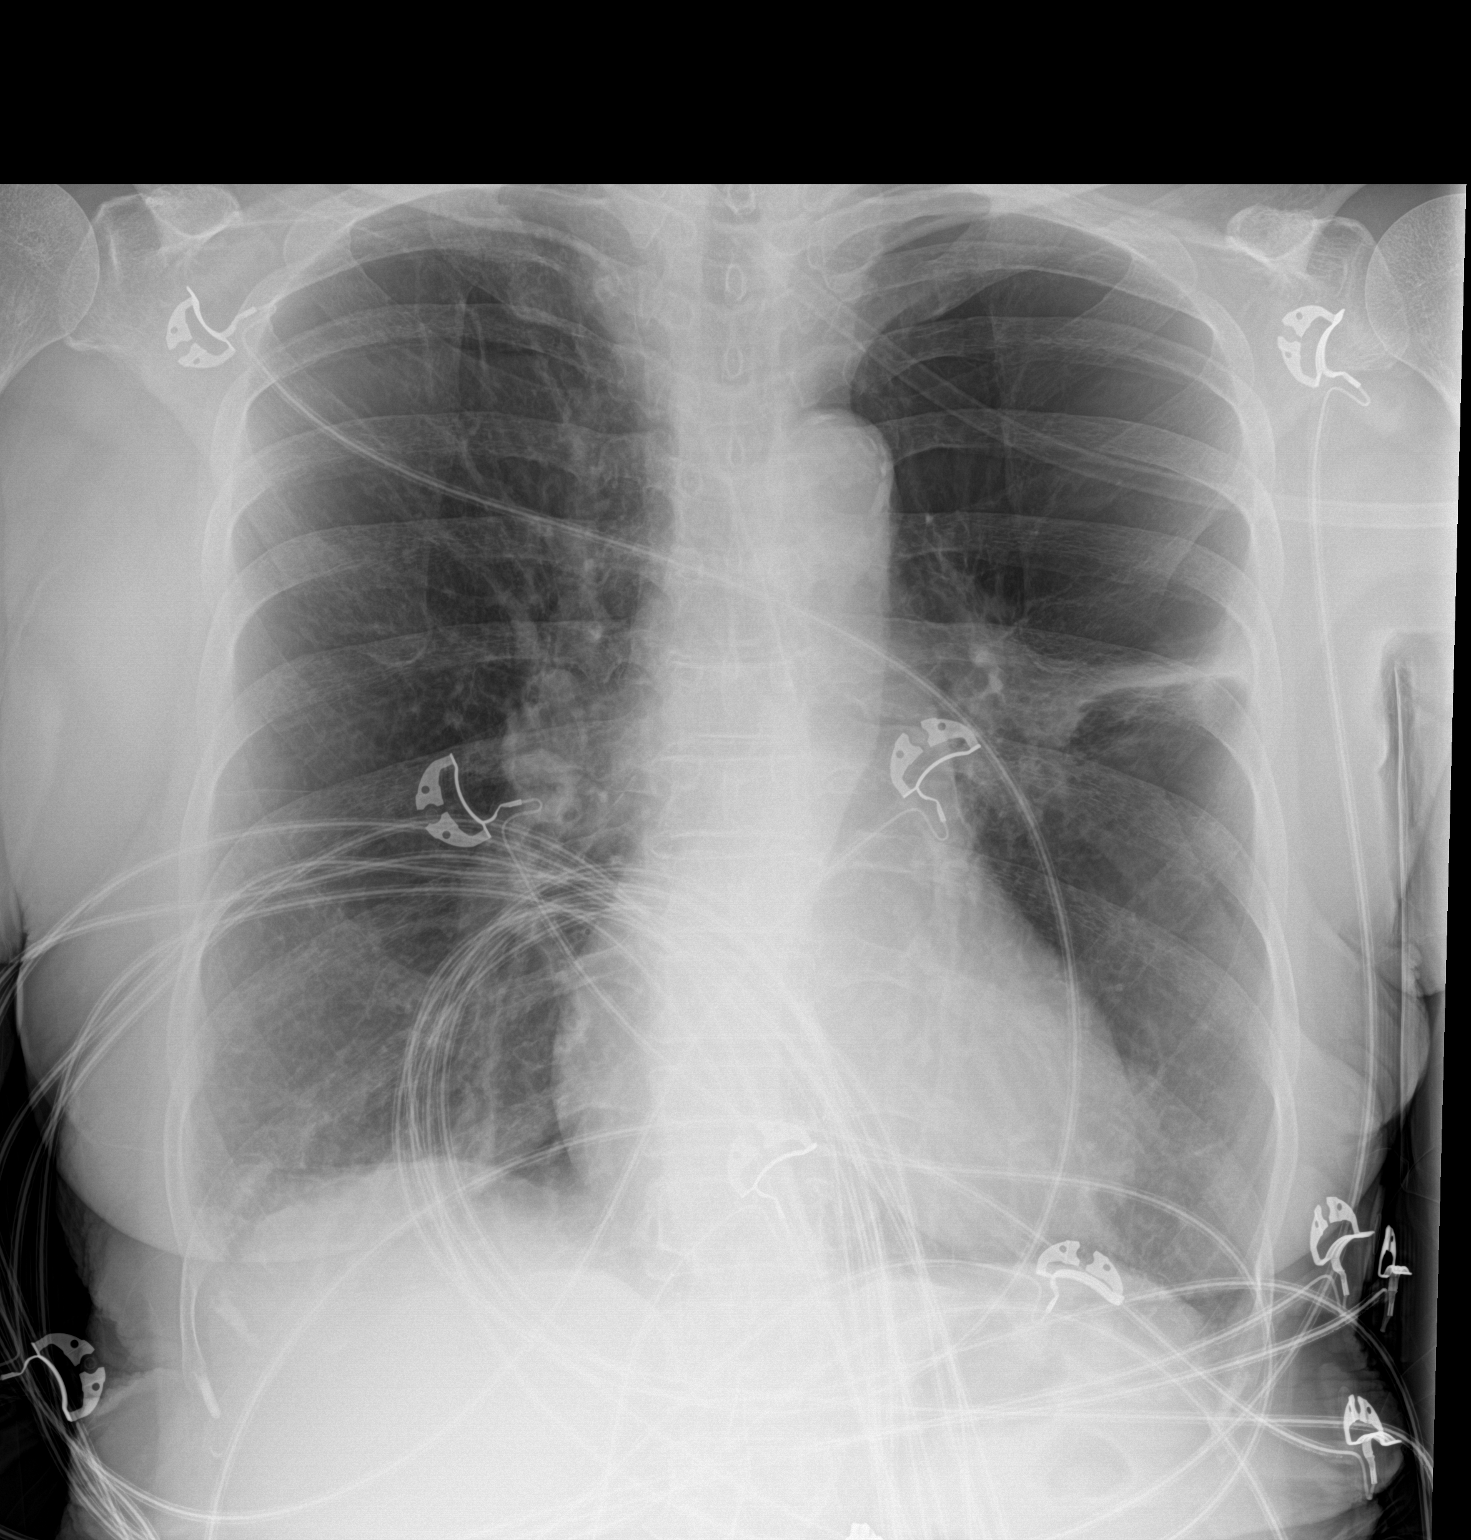

[1 of 1 positions shown; findings below may reference images not displayed]

FINDINGS: Normal heart size and pulmonary vascularity. Emphysematous changes
in the lungs. Left hilar mass with linear scarring in the left mid
lung. This corresponds to known cancer with probable recurrence as
indicated on the prior chest CT. No developing consolidation in the
lungs. No pleural effusions. No pneumothorax. Calcification of the
aorta.
IMPRESSION: Left hilar mass with scarring in the left mid lung corresponding to
known carcinoma. Emphysematous changes. No evidence of active
infiltration.

## 2021-02-11 ENCOUNTER — Encounter: Payer: Self-pay | Admitting: Oncology

## 2021-02-19 NOTE — Progress Notes (Signed)
[facility]  [address] [place],  [place]  [address] [phone]  Clinic Day:  02/26/2021  This document serves as a record of services personally performed by Marice Potter, MD. It was created on their behalf by Curry,Lauren E, a trained medical scribe. The creation of this record is based on the scribe's personal observations and the provider's statements to them.  HISTORY OF PRESENT ILLNESS:  The patient is a 80 y.o. 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  Due to her progressive weakness and desire not to seek further treatment for her cancer, the patient has been under the palliative care program in [facility].  Unfortunately, she comes in today very weak and confused.  Her son reports she waxes and wanes with her mentation.  He has also noticed her to have intermittent hemoptysis.  He claims she requires care around the clock due to the decline in her health.    With respect to her lung cancer history, an initial lung biopsy was very suspicious, but not definitive, for squamous cell lung cancer.  She had 3750 cGy of conventional radiation to her left hilum in 2020.   She also underwent stereotactic radiation to this region in early March 2021. As recent scans showed disease progression in this same area, she recently completed another 3000 cGy to her left hilum in April 2022.  Unfortunately, CT scans done showed evidence of further local disease progression.  However, no further radiation could be rendered to this area.  The patient was never interested in considering systemic therapy.    PHYSICAL EXAM:  Blood pressure 133/64, pulse (!) 108, resp. rate 20, height 4\' 10"  (1.473 m), SpO2 94 %. Wt Readings from Last 3 Encounters:  02/25/21 91 lb 0.8 oz (41.3 kg)  02/23/21 91 lb (41.3 kg)  01/24/21 91 lb 14.4 oz (41.7 kg)   Body mass index is 19.03 kg/m. Performance status (ECOG): 3 Physical Exam Constitutional:       General: She is not in acute distress.    Appearance: She is normal weight. She is ill-appearing.     Comments: She is in a wheelchair;  she is wearing oxygen per nasal canula; she looks gravely ill  HENT:     Head: Normocephalic and atraumatic.     Mouth/Throat:     Mouth: Mucous membranes are moist.     Pharynx: Oropharynx is clear. No oropharyngeal exudate or posterior oropharyngeal erythema.  Eyes:     General: No scleral icterus.    Extraocular Movements: Extraocular movements intact.     Conjunctiva/sclera: Conjunctivae normal.     Pupils: Pupils are equal, round, and reactive to light.  Cardiovascular:     Rate and Rhythm: Normal rate and regular rhythm.     Pulses: Normal pulses.     Heart sounds: Normal heart sounds. No murmur heard.   No friction rub. No gallop.  Pulmonary:     Effort: Pulmonary effort is normal. No respiratory distress.     Breath sounds: Decreased air movement present. Decreased breath sounds present. No wheezing, rhonchi or rales.  Abdominal:     General: Bowel sounds are normal. There is no distension.     Palpations: Abdomen is soft. There is no hepatomegaly, splenomegaly or mass.     Tenderness: There is no abdominal tenderness.  Musculoskeletal:        General: No swelling. Normal range of motion.     Cervical  back: Normal range of motion and neck supple.     Right lower leg: No edema.     Left lower leg: No edema.  Lymphadenopathy:     Cervical: No cervical adenopathy.     Upper Body:     Right upper body: No supraclavicular or axillary adenopathy.     Left upper body: No supraclavicular or axillary adenopathy.     Lower Body: No right inguinal adenopathy. No left inguinal adenopathy.  Skin:    General: Skin is warm and dry.     Coloration: Skin is not jaundiced.     Findings: No lesion or rash.  Neurological:     General: No focal deficit present.     Mental Status: She is oriented to person, place, and time. Mental status is at baseline.   Psychiatric:        Mood and Affect: Mood normal.        Behavior: Behavior normal.        Thought Content: Thought content normal.        Judgment: Judgment normal.   LABS:    Ref. Range 02/26/2021 00:00  WBC Unknown 16.6  RBC Latest Ref Range: 3.87 - 5.11  3.24 (A)  Hemoglobin Latest Ref Range: 12.0 - 16.0  8.2 (A)  HCT Latest Ref Range: 36 - 46  26 (A)  MCV Latest Ref Range: 81 - 99  82  Platelets Latest Ref Range: 150 - 399  468 (A)    CMP Latest Ref Rng & Units 02/26/2021 02/23/2021 01/24/2021  Glucose 70 - 99 mg/dL - 137(H) -  BUN 4 - 21 18 7(L) 9  Creatinine 0.5 - 1.1 0.6 0.46 0.5  Sodium 137 - 147 137 136 136(A)  Potassium 3.4 - 5.3 5.2 4.6 4.5  Chloride 99 - 108 94(A) 90(L) 96(A)  CO2 13 - 22 37(A) 37(H) 38(A)  Calcium 8.7 - 10.7 10.4 10.5(H) 9.6  Total Protein 6.5 - 8.1 g/dL - 8.7(H) -  Total Bilirubin 0.3 - 1.2 mg/dL - 0.3 -  Alkaline Phos 25 - 125 97 85 84  AST 13 - 35 33 10(L) 20  ALT 7 - 35 18 7 7     ASSESSMENT & PLAN:  Assessment/Plan:  A 80 y.o. female who clinically behaves today as if she is in the terminal stages of her lung cancer.  In clinic today, I had a candid conversation with both the patient and her son.  Her life expectancy may not extend beyond the next 1-2 weeks.  I did recommend the hospice house, primarily because her son says she essentially needs medical care around the house.  However, she has declined this option.  Nevertheless, the patient will go home under hospice care.  No additional follow-up visits will be scheduled.  However, the patient and her son know to contact our office before his next visit if they have additional questions regarding her clinical picture.     I, Rita Ohara, am acting as scribe for Marice Potter, MD    I have reviewed this report as typed by the medical scribe, and it is complete and accurate.  Skyleen Bentley Macarthur Critchley, MD

## 2021-02-22 ENCOUNTER — Telehealth: Payer: Self-pay | Admitting: Oncology

## 2021-02-22 NOTE — Telephone Encounter (Signed)
Patient notified of 11/14 Appt's

## 2021-02-23 ENCOUNTER — Other Ambulatory Visit: Payer: Self-pay

## 2021-02-23 ENCOUNTER — Emergency Department (HOSPITAL_COMMUNITY)
Admission: EM | Admit: 2021-02-23 | Discharge: 2021-02-23 | Disposition: A | Discharge status: Home / Self Care | Payer: Medicare Other | Attending: Emergency Medicine | Admitting: Emergency Medicine

## 2021-02-23 ENCOUNTER — Emergency Department (HOSPITAL_COMMUNITY): Payer: Medicare Other

## 2021-02-23 DIAGNOSIS — J45909 Unspecified asthma, uncomplicated: Secondary | ICD-10-CM | POA: Insufficient documentation

## 2021-02-23 DIAGNOSIS — Z515 Encounter for palliative care: Secondary | ICD-10-CM | POA: Insufficient documentation

## 2021-02-23 DIAGNOSIS — I10 Essential (primary) hypertension: Secondary | ICD-10-CM | POA: Diagnosis not present

## 2021-02-23 DIAGNOSIS — J449 Chronic obstructive pulmonary disease, unspecified: Secondary | ICD-10-CM | POA: Diagnosis not present

## 2021-02-23 DIAGNOSIS — Z79899 Other long term (current) drug therapy: Secondary | ICD-10-CM | POA: Insufficient documentation

## 2021-02-23 DIAGNOSIS — C3490 Malignant neoplasm of unspecified part of unspecified bronchus or lung: Secondary | ICD-10-CM | POA: Diagnosis not present

## 2021-02-23 DIAGNOSIS — Z87891 Personal history of nicotine dependence: Secondary | ICD-10-CM | POA: Diagnosis not present

## 2021-02-23 DIAGNOSIS — Z8616 Personal history of COVID-19: Secondary | ICD-10-CM | POA: Diagnosis not present

## 2021-02-23 DIAGNOSIS — Z7982 Long term (current) use of aspirin: Secondary | ICD-10-CM | POA: Diagnosis not present

## 2021-02-23 DIAGNOSIS — R042 Hemoptysis: Secondary | ICD-10-CM | POA: Diagnosis present

## 2021-02-23 LAB — URINALYSIS, ROUTINE W REFLEX MICROSCOPIC
Bacteria, UA: NONE SEEN
Bilirubin Urine: NEGATIVE
Glucose, UA: NEGATIVE mg/dL
Hgb urine dipstick: NEGATIVE
Ketones, ur: 5 mg/dL — AB
Leukocytes,Ua: NEGATIVE
Nitrite: NEGATIVE
Protein, ur: 30 mg/dL — AB
Specific Gravity, Urine: 1.015 (ref 1.005–1.030)
pH: 9 — ABNORMAL HIGH (ref 5.0–8.0)

## 2021-02-23 LAB — CBC WITH DIFFERENTIAL/PLATELET
Abs Immature Granulocytes: 0.05 K/uL (ref 0.00–0.07)
Basophils Absolute: 0.1 K/uL (ref 0.0–0.1)
Basophils Relative: 1 %
Eosinophils Absolute: 0 K/uL (ref 0.0–0.5)
Eosinophils Relative: 0 %
HCT: 32.6 % — ABNORMAL LOW (ref 36.0–46.0)
Hemoglobin: 9.8 g/dL — ABNORMAL LOW (ref 12.0–15.0)
Immature Granulocytes: 1 %
Lymphocytes Relative: 7 %
Lymphs Abs: 0.7 K/uL (ref 0.7–4.0)
MCH: 25.5 pg — ABNORMAL LOW (ref 26.0–34.0)
MCHC: 30.1 g/dL (ref 30.0–36.0)
MCV: 84.9 fL (ref 80.0–100.0)
Monocytes Absolute: 0.4 K/uL (ref 0.1–1.0)
Monocytes Relative: 4 %
Neutro Abs: 9.6 K/uL — ABNORMAL HIGH (ref 1.7–7.7)
Neutrophils Relative %: 87 %
Platelets: 292 K/uL (ref 150–400)
RBC: 3.84 MIL/uL — ABNORMAL LOW (ref 3.87–5.11)
RDW: 18.5 % — ABNORMAL HIGH (ref 11.5–15.5)
WBC: 10.9 K/uL — ABNORMAL HIGH (ref 4.0–10.5)
nRBC: 0 % (ref 0.0–0.2)

## 2021-02-23 LAB — COMPREHENSIVE METABOLIC PANEL WITH GFR
ALT: 7 U/L (ref 0–44)
AST: 10 U/L — ABNORMAL LOW (ref 15–41)
Albumin: 2.8 g/dL — ABNORMAL LOW (ref 3.5–5.0)
Alkaline Phosphatase: 85 U/L (ref 38–126)
Anion gap: 9 (ref 5–15)
BUN: 7 mg/dL — ABNORMAL LOW (ref 8–23)
CO2: 37 mmol/L — ABNORMAL HIGH (ref 22–32)
Calcium: 10.5 mg/dL — ABNORMAL HIGH (ref 8.9–10.3)
Chloride: 90 mmol/L — ABNORMAL LOW (ref 98–111)
Creatinine, Ser: 0.46 mg/dL (ref 0.44–1.00)
GFR, Estimated: >60 mL/min (ref >60)
Glucose, Bld: 137 mg/dL — ABNORMAL HIGH (ref 70–99)
Potassium: 4.6 mmol/L (ref 3.5–5.1)
Sodium: 136 mmol/L (ref 135–145)
Total Bilirubin: 0.3 mg/dL (ref 0.3–1.2)
Total Protein: 8.7 g/dL — ABNORMAL HIGH (ref 6.5–8.1)

## 2021-02-23 LAB — PROTIME-INR
INR: 1.2 (ref 0.8–1.2)
Prothrombin Time: 15.3 seconds — ABNORMAL HIGH (ref 11.4–15.2)

## 2021-02-23 LAB — LACTIC ACID, PLASMA: Lactic Acid, Venous: 1.3 mmol/L (ref 0.5–1.9)

## 2021-02-23 MED ORDER — OXYCODONE-ACETAMINOPHEN 5-325 MG PO TABS
1.0000 | ORAL_TABLET | Freq: Once | ORAL | Status: AC
  Administered 2021-02-23: 1 via ORAL
  Filled 2021-02-23: qty 1

## 2021-02-23 NOTE — Discharge Instructions (Signed)
Call your hospice team when you get home. The chest xray shows increase size of your lung cancer.  Return immediately back to the ER if:  Your symptoms worsen within the next 12-24 hours. You develop new symptoms such as new fevers, persistent vomiting, new pain, shortness of breath, or new weakness or numbness, or if you have any other concerns.

## 2021-02-23 NOTE — ED Provider Notes (Signed)
[facility] EMERGENCY DEPARTMENT Provider Note   CSN: [id number] Arrival date & time: 02/23/21  3086     History Chief Complaint  Patient presents with   Hemoptysis    Donna Cross is a 80 y.o. female.  Patient with history of lung cancer, presents with complaint of hemoptysis.  She states she had a similar episode several weeks ago that lasted about a day, today's episode also been going on for about a day.  Describes streaky sputum that appears bright red every time she coughs.  Denies any fevers no vomiting or diarrhea reported.  Patient does not receiving any chemotherapy or radiation or surgery.  She is followed by oncology, but has decided not to pursue any additional treatment for her cancer and has excepted natural disease progression.  She has care nurses that come and check on her at home.      Past Medical History:  Diagnosis Date   Asthma    COPD (chronic obstructive pulmonary disease) (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[facility])     Patient Active Problem List   Diagnosis Date Noted   COVID-19 virus infection 05/10/2020   Iron deficiency anemia 05/10/2020   COPD exacerbation ([place]) 05/10/2020   Failure to thrive in adult 05/10/2020   Malignant neoplasm of upper lobe of left lung ([place]) 05/18/2019   Postobstructive pneumonia 02/02/2018   COPD with acute exacerbation ([place]) 02/02/2018   Lung mass 02/01/2018   Hemoptysis 02/01/2018   HYPERLIPIDEMIA 07/10/2007   Essential hypertension 07/10/2007   EMPHYSEMA 07/10/2007   PULMONARY NODULE 07/10/2007   OSTEOPENIA 07/10/2007    No past surgical history on file.   OB History   No obstetric history on file.     No family history on file.  Social History   Tobacco Use   Smoking status: Former   Smokeless tobacco: Never  Scientific laboratory technician Use: Never used  Substance Use Topics   Alcohol use: No   Drug use: No    Home Medications Prior to Admission  medications   Medication Sig Start Date End Date Taking? Authorizing Provider  ALPRAZolam Duanne Moron) 0.5 MG tablet Take 0.5 mg by mouth 2 (two) times daily as needed for anxiety. 05/02/20   [provider]  amLODipine (NORVASC) 10 MG tablet Take 10 mg by mouth daily. 03/25/19   [provider]  aspirin 81 MG chewable tablet Chew 81 mg by mouth daily.    [provider]  doxycycline (VIBRAMYCIN) 100 MG capsule Take 1 capsule (100 mg total) by mouth 2 (two) times daily. 07/01/20   Blanchie Dessert, MD  ezetimibe (ZETIA) 10 MG tablet Take 10 mg by mouth daily. 04/15/19   [provider]  ferrous sulfate 325 (65 FE) MG tablet Take 1 tablet (325 mg total) by mouth daily. 07/01/20   Blanchie Dessert, MD  Flaxseed, Linseed, (FLAXSEED OIL PO) Take 1 capsule by mouth daily.    [provider]  loratadine (CLARITIN) 10 MG tablet Take 10 mg by mouth daily.    [provider]  megestrol (MEGACE) 400 MG/10ML suspension Take 20 mLs (800 mg total) by mouth daily. 09/04/20   Lewis, Dequincy A, MD  omeprazole (PRILOSEC) 20 MG capsule Take 20 mg by mouth daily.    [provider]  oxyCODONE (ROXICODONE) 15 MG immediate release tablet Take 1 tablet (15 mg total) by mouth every 6 (six) hours as needed  for pain. 01/24/21   Marice Potter, MD  OXYGEN Inhale into the lungs.    [provider]  pregabalin (LYRICA) 75 MG capsule Take 75 mg by mouth 2 (two) times daily. 03/05/19   [provider]  solifenacin (VESICARE) 10 MG tablet Take 10 mg by mouth daily. 03/25/19   [provider]  TRELEGY ELLIPTA 100-62.5-25 MCG/INH AEPB Take 1 puff by mouth daily.  12/25/17   [provider]  VENTOLIN HFA 108 (90 Base) MCG/ACT inhaler Inhale 2 puffs into the lungs every 6 (six) hours as needed for wheezing or shortness of breath.  12/14/17   [provider]    Allergies    Penicillins  Review of Systems   Review of Systems   Constitutional:  Negative for fever.  HENT:  Negative for ear pain.   Eyes:  Negative for pain.  Respiratory:  Positive for cough.   Cardiovascular:  Negative for chest pain.  Gastrointestinal:  Negative for abdominal pain.  Genitourinary:  Negative for flank pain.  Musculoskeletal:  Negative for back pain.  Skin:  Negative for rash.  Neurological:  Negative for headaches.   Physical Exam Updated Vital Signs BP (!) 126/56   Pulse 86   Temp 98.8 F (37.1 C) (Oral)   Resp 20   Ht 4' 11"  (1.499 m)   Wt 41.3 kg   SpO2 98%   BMI 18.38 kg/m   Physical Exam Constitutional:      General: She is not in acute distress.    Appearance: Normal appearance.  HENT:     Head: Normocephalic.     Nose: Nose normal.  Eyes:     Extraocular Movements: Extraocular movements intact.  Cardiovascular:     Rate and Rhythm: Normal rate.  Pulmonary:     Effort: Pulmonary effort is normal.  Musculoskeletal:        General: Normal range of motion.     Cervical back: Normal range of motion.  Neurological:     General: No focal deficit present.     Mental Status: She is alert and oriented to person, place, and time. Mental status is at baseline.    ED Results / Procedures / Treatments   Labs (all labs ordered are listed, but only abnormal results are displayed) Labs Reviewed  COMPREHENSIVE METABOLIC PANEL - Abnormal; Notable for the following components:      Result Value   Chloride 90 (*)    CO2 37 (*)    Glucose, Bld 137 (*)    BUN 7 (*)    Calcium 10.5 (*)    Total Protein 8.7 (*)    Albumin 2.8 (*)    AST 10 (*)    All other components within normal limits  CBC WITH DIFFERENTIAL/PLATELET - Abnormal; Notable for the following components:   WBC 10.9 (*)    RBC 3.84 (*)    Hemoglobin 9.8 (*)    HCT 32.6 (*)    MCH 25.5 (*)    RDW 18.5 (*)    Neutro Abs 9.6 (*)    All other components within normal limits  PROTIME-INR - Abnormal; Notable for the following components:    Prothrombin Time 15.3 (*)    All other components within normal limits  CULTURE, BLOOD (ROUTINE X 2)  CULTURE, BLOOD (ROUTINE X 2)  LACTIC ACID, PLASMA  LACTIC ACID, PLASMA  URINALYSIS, ROUTINE W REFLEX MICROSCOPIC    EKG EKG Interpretation  Date/Time:  Friday February 23 2021 08:28:27 EST  Ventricular Rate:  107 PR Interval:  126 QRS Duration: 71 QT Interval:  308 QTC Calculation: 411 R Axis:   101 Text Interpretation: Sinus tachycardia Biatrial enlargement Anterolateral infarct, age indeterminate Confirmed by Thamas Jaegers ([id number]) on 02/23/2021 9:00:49 AM  Radiology DG Chest 2 View  Result Date: 02/23/2021 CLINICAL DATA:  Suspected sepsis, shortness of breath, hemoptysis EXAM: CHEST - 2 VIEW COMPARISON:  07/01/2020 FINDINGS: Significant interval increase in size and conspicuity of a mass of the perihilar left lung. Small associated left pleural effusion. The right lung is normally aerated. IMPRESSION: 1. Significant interval increase in size and conspicuity of a mass of the perihilar left lung. Small associated left pleural effusion. 2.  The right lung is normally aerated. Electronically Signed   By: Delanna Ahmadi M.D.   On: 02/23/2021 09:07    Procedures Procedures   Medications Ordered in ED Medications  oxyCODONE-acetaminophen (PERCOCET/ROXICET) 5-325 MG per tablet 1 tablet (1 tablet Oral Given 02/23/21 1138)    ED Course  I have reviewed the triage vital signs and the nursing notes.  Pertinent labs & imaging results that were available during my care of the patient were reviewed by me and considered in my medical decision making (see chart for details).    MDM Rules/Calculators/A&P                           Patient arrives on her baseline oxygen requirements.  She is otherwise awake and alert answers all questions appropriately.  Labs show normal white count of 11 hemoglobin similar to prior levels at 9.8.  Chest x-ray is concerning for interval increase in size of  her lung cancer.  Initially CT angio has been ordered, this was before acknowledgment that the patient was in hospice treatment for her lung cancer which is likely cause of her hemoptysis and increased mass and chest x-ray.  Patient appears comfortable at this time goals of care appear to be have met.  Will advise continued hospice care at home.  Patient is agreeable to plan.  Son was at bedside initially, however I tried to call him regarding labs and final disposition, however he had left her side and was not picking up the phone.  Patient appears stable at this time, will be discharged home in stable condition to follow-up with her hospice team and oncology team on an outpatient basis within the week.  Advised return if she has pain or any additional concerns.  Final Clinical Impression(s) / ED Diagnoses Final diagnoses:  Malignant neoplasm of lung, unspecified laterality, unspecified part of lung [facility])  Hospice care    Rx / DC Orders ED Discharge Orders     None        Luna Fuse, MD 02/23/21 1442

## 2021-02-23 NOTE — ED Triage Notes (Signed)
Pt began coughing up blood last night, had been having increased weakness, she has COPD and is on 4L [place] at home, also has lung cancer

## 2021-02-25 ENCOUNTER — Emergency Department (HOSPITAL_COMMUNITY)
Admission: EM | Admit: 2021-02-25 | Discharge: 2021-02-25 | Disposition: A | Discharge status: Home / Self Care | Payer: Medicare Other | Attending: Emergency Medicine | Admitting: Emergency Medicine

## 2021-02-25 ENCOUNTER — Other Ambulatory Visit: Payer: Self-pay

## 2021-02-25 ENCOUNTER — Emergency Department (HOSPITAL_COMMUNITY): Payer: Medicare Other

## 2021-02-25 ENCOUNTER — Encounter (HOSPITAL_COMMUNITY): Payer: Self-pay | Admitting: *Deleted

## 2021-02-25 DIAGNOSIS — C3490 Malignant neoplasm of unspecified part of unspecified bronchus or lung: Secondary | ICD-10-CM | POA: Diagnosis not present

## 2021-02-25 DIAGNOSIS — J449 Chronic obstructive pulmonary disease, unspecified: Secondary | ICD-10-CM | POA: Insufficient documentation

## 2021-02-25 DIAGNOSIS — R0789 Other chest pain: Secondary | ICD-10-CM | POA: Diagnosis not present

## 2021-02-25 DIAGNOSIS — Z87891 Personal history of nicotine dependence: Secondary | ICD-10-CM | POA: Diagnosis not present

## 2021-02-25 DIAGNOSIS — Z8616 Personal history of COVID-19: Secondary | ICD-10-CM | POA: Diagnosis not present

## 2021-02-25 DIAGNOSIS — R000 Tachycardia, unspecified: Secondary | ICD-10-CM | POA: Insufficient documentation

## 2021-02-25 DIAGNOSIS — J45909 Unspecified asthma, uncomplicated: Secondary | ICD-10-CM | POA: Diagnosis not present

## 2021-02-25 DIAGNOSIS — Z79899 Other long term (current) drug therapy: Secondary | ICD-10-CM | POA: Diagnosis not present

## 2021-02-25 DIAGNOSIS — Z7982 Long term (current) use of aspirin: Secondary | ICD-10-CM | POA: Insufficient documentation

## 2021-02-25 DIAGNOSIS — I10 Essential (primary) hypertension: Secondary | ICD-10-CM | POA: Diagnosis not present

## 2021-02-25 DIAGNOSIS — R079 Chest pain, unspecified: Secondary | ICD-10-CM | POA: Diagnosis present

## 2021-02-25 MED ORDER — ONDANSETRON 4 MG PO TBDP: 4.0000 mg | ORAL_TABLET | Freq: Three times a day (TID) | ORAL | 0 refills | Status: DC | PRN

## 2021-02-25 MED ORDER — MORPHINE SULFATE (PF) 4 MG/ML IV SOLN
4.0000 mg | Freq: Once | INTRAVENOUS | Status: AC
  Administered 2021-02-25: 4 mg via INTRAVENOUS
  Filled 2021-02-25: qty 1

## 2021-02-25 MED ORDER — SODIUM CHLORIDE 0.9 % IV BOLUS
1000.0000 mL | Freq: Once | INTRAVENOUS | Status: AC
  Administered 2021-02-25: 1000 mL via INTRAVENOUS

## 2021-02-25 MED ORDER — ASPIRIN 81 MG PO CHEW
324.0000 mg | CHEWABLE_TABLET | Freq: Once | ORAL | Status: DC
  Filled 2021-02-25: qty 4

## 2021-02-25 MED ORDER — SODIUM CHLORIDE 0.9 % IV SOLN: INTRAVENOUS | Status: DC

## 2021-02-25 MED ORDER — ONDANSETRON HCL 4 MG/2ML IJ SOLN
4.0000 mg | Freq: Once | INTRAMUSCULAR | Status: AC
  Administered 2021-02-25: 4 mg via INTRAVENOUS
  Filled 2021-02-25: qty 2

## 2021-02-25 MED ORDER — ONDANSETRON 4 MG PO TBDP: 4.0000 mg | ORAL_TABLET | Freq: Three times a day (TID) | ORAL | 0 refills | Status: AC | PRN

## 2021-02-25 MED ORDER — NITROGLYCERIN 2 % TD OINT
1.0000 [in_us] | TOPICAL_OINTMENT | Freq: Four times a day (QID) | TRANSDERMAL | Status: DC
  Filled 2021-02-25: qty 1

## 2021-02-25 NOTE — ED Triage Notes (Signed)
The pt arrived by gems from home she has lung cancer and jusyt went hospital from some [place] 2 days ago  she woke up tonight with the chest pain ems gave 324mg  aspirin  her pain had stopped  when she got into the truck  no pain on arrival to the ed

## 2021-02-25 NOTE — Discharge Instructions (Addendum)
You were seen in the emergency department for evaluation of chest pain and concern for possible heart attack.  The cardiologist are not as concerned for a heart attack today and based on her goals of care, we as a team of decided not to pursue invasive testing and instead you will be discharged with comfort measures.

## 2021-02-25 NOTE — ED Provider Notes (Signed)
[facility] EMERGENCY DEPARTMENT Provider Note   CSN: [id number] Arrival date & time: 02/25/21  0456     History Chief Complaint  Patient presents with   Chest Pain    Donna Cross is a 80 y.o. female.  HPI     This is a 80 year old female with a history of COPD, hypertension, small cell lung cancer who presents with chest pain.  Patient reports that she woke up this morning with anterior chest pain.  It does not radiate.  She reports that it is dull in nature.  She does not identify any alleviating or exacerbating factors.  She reports chronic shortness of breath which is not necessarily worsened.  No lower extremity swelling or history of blood clots.  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  Currently not receiving any treatment and is in hospice care as she has declined any further advanced treatments.  She was seen and evaluated on Friday for worsening episodes of hemoptysis.  Work-up was largely reassuring although imaging was not obtained given goals of care.  Patient was initially evaluated and brought in by EMS as a STEMI; however, this was canceled by the cardiology team given patient's history and goals of care.  See clinical course below.  Patient currently is having some "mild discomfort."  Denies any recent fevers.  Does have an ongoing cough.  Past Medical History:  Diagnosis Date   Asthma    COPD (chronic obstructive pulmonary disease) ([place])    Emphysema    Hypertension    Small cell lung cancer [facility])     Patient Active Problem List   Diagnosis Date Noted   COVID-19 virus infection 05/10/2020   Iron deficiency anemia 05/10/2020   COPD exacerbation ([place]) 05/10/2020   Failure to thrive in adult 05/10/2020   Malignant neoplasm of upper lobe of left lung ([place]) 05/18/2019   Postobstructive pneumonia 02/02/2018   COPD with acute exacerbation ([place]) 02/02/2018   Lung mass 02/01/2018   Hemoptysis 02/01/2018   HYPERLIPIDEMIA 07/10/2007    Essential hypertension 07/10/2007   EMPHYSEMA 07/10/2007   PULMONARY NODULE 07/10/2007   OSTEOPENIA 07/10/2007    History reviewed. No pertinent surgical history.   OB History   No obstetric history on file.     No family history on file.  Social History   Tobacco Use   Smoking status: Former   Smokeless tobacco: Never  Scientific laboratory technician Use: Never used  Substance Use Topics   Alcohol use: No   Drug use: No    Home Medications Prior to Admission medications   Medication Sig Start Date End Date Taking? Authorizing Provider  ondansetron (ZOFRAN ODT) 4 MG disintegrating tablet Take 1 tablet (4 mg total) by mouth every 8 (eight) hours as needed for nausea or vomiting. 02/25/21  Yes Saber Dickerman, Barbette Hair, MD  ALPRAZolam Duanne Moron) 0.5 MG tablet Take 0.5 mg by mouth 2 (two) times daily as needed for anxiety. 05/02/20   [provider]  amLODipine (NORVASC) 10 MG tablet Take 10 mg by mouth daily. 03/25/19   [provider]  aspirin 81 MG chewable tablet Chew 81 mg by mouth daily.    [provider]  doxycycline (VIBRAMYCIN) 100 MG capsule Take 1 capsule (100 mg total) by mouth 2 (two) times daily. 07/01/20   Blanchie Dessert, MD  ezetimibe (ZETIA) 10 MG tablet Take 10 mg by mouth daily. 04/15/19   [provider]  ferrous sulfate 325 (65 FE) MG  tablet Take 1 tablet (325 mg total) by mouth daily. 07/01/20   Blanchie Dessert, MD  Flaxseed, Linseed, (FLAXSEED OIL PO) Take 1 capsule by mouth daily.    [provider]  loratadine (CLARITIN) 10 MG tablet Take 10 mg by mouth daily.    [provider]  megestrol (MEGACE) 400 MG/10ML suspension Take 20 mLs (800 mg total) by mouth daily. 09/04/20   Lewis, Dequincy A, MD  omeprazole (PRILOSEC) 20 MG capsule Take 20 mg by mouth daily.    [provider]  oxyCODONE (ROXICODONE) 15 MG immediate release tablet Take 1 tablet (15 mg total) by mouth every 6 (six) hours as needed for pain.  01/24/21   Marice Potter, MD  OXYGEN Inhale into the lungs.    [provider]  pregabalin (LYRICA) 75 MG capsule Take 75 mg by mouth 2 (two) times daily. 03/05/19   [provider]  solifenacin (VESICARE) 10 MG tablet Take 10 mg by mouth daily. 03/25/19   [provider]  TRELEGY ELLIPTA 100-62.5-25 MCG/INH AEPB Take 1 puff by mouth daily.  12/25/17   [provider]  VENTOLIN HFA 108 (90 Base) MCG/ACT inhaler Inhale 2 puffs into the lungs every 6 (six) hours as needed for wheezing or shortness of breath.  12/14/17   [provider]    Allergies    Penicillins  Review of Systems   Review of Systems  Constitutional:  Negative for fever.  Respiratory:  Positive for cough. Negative for shortness of breath.   Cardiovascular:  Positive for chest pain. Negative for leg swelling.  Gastrointestinal:  Negative for abdominal pain, nausea and vomiting.  Genitourinary:  Negative for dysuria.  All other systems reviewed and are negative.  Physical Exam Updated Vital Signs BP 122/64 (BP Location: Right Arm)   Pulse (!) 112   Temp 97.9 F (36.6 C) (Oral)   Resp (!) 26   Ht 1.499 m (4\' 11" )   Wt 41.3 kg   SpO2 100%   BMI 18.39 kg/m   Physical Exam Vitals and nursing note reviewed.  Constitutional:      Comments: Chronically ill-appearing but nontoxic, ABCs intact, thin, frail  HENT:     Head: Normocephalic and atraumatic.     Mouth/Throat:     Mouth: Mucous membranes are dry.  Eyes:     Pupils: Pupils are equal, round, and reactive to light.  Cardiovascular:     Rate and Rhythm: Regular rhythm. Tachycardia present.     Heart sounds: Normal heart sounds.  Pulmonary:     Effort: Pulmonary effort is normal. No respiratory distress.     Breath sounds: No wheezing.  Abdominal:     General: Bowel sounds are normal.     Palpations: Abdomen is soft.  Musculoskeletal:     Cervical back: Neck supple.     Right lower leg: No edema.     Left  lower leg: No edema.  Skin:    General: Skin is warm and dry.  Neurological:     Mental Status: She is alert and oriented to person, place, and time.  Psychiatric:        Mood and Affect: Mood normal.    ED Results / Procedures / Treatments   Labs (all labs ordered are listed, but only abnormal results are displayed) Labs Reviewed  RESP PANEL BY RT-PCR (FLU A&B, COVID) [id number]    EKG None  Radiology DG Chest 2 View  Result Date: 02/23/2021 CLINICAL DATA:  Suspected  sepsis, shortness of breath, hemoptysis EXAM: CHEST - 2 VIEW COMPARISON:  07/01/2020 FINDINGS: Significant interval increase in size and conspicuity of a mass of the perihilar left lung. Small associated left pleural effusion. The right lung is normally aerated. IMPRESSION: 1. Significant interval increase in size and conspicuity of a mass of the perihilar left lung. Small associated left pleural effusion. 2.  The right lung is normally aerated. Electronically Signed   By: Delanna Ahmadi M.D.   On: 02/23/2021 09:07    Procedures .Critical Care Performed by: Merryl Hacker, MD Authorized by: Merryl Hacker, MD   Critical care provider statement:    Critical care time (minutes):  30   Critical care was necessary to treat or prevent imminent or life-threatening deterioration of the following conditions:  Dehydration (end of life care)   Critical care was time spent personally by me on the following activities:  Development of treatment plan with patient or surrogate, discussions with consultants, evaluation of patient's response to treatment, examination of patient, re-evaluation of patient's condition, review of old charts and obtaining history from patient or surrogate   Medications Ordered in ED Medications  0.9 %  sodium chloride infusion (has no administration in time range)  sodium chloride 0.9 % bolus 1,000 mL (has no administration in time range)  morphine 4 MG/ML injection 4 mg (4 mg Intravenous Given  02/25/21 0620)  ondansetron (ZOFRAN) injection 4 mg (4 mg Intravenous Given 02/25/21 6659)    ED Course  I have reviewed the triage vital signs and the nursing notes.  Pertinent labs & imaging results that were available during my care of the patient were reviewed by me and considered in my medical decision making (see chart for details).  Clinical Course as of 02/25/21 9357  Sun Feb 25, 2021  0500 Spoke with cardiology fellow.  STEMI was canceled given patient's history of lung cancer and complicated medical problems per cardiology team and Dr. Angelena Form.  We discussed her EKG that was obtained in the department.  She does have some very minimal ST elevations inferior and laterally.  She is also slightly tachycardic.  Certainly has risk factors for other etiologies of chest pain including PE or ongoing lung cancer.  Will obtain lab work and reengage cardiology when additional information is available. [CH]  0503 Chart reviewed.  Appears patient is on hospice.  We will obtain lab work but will discuss further evaluation including imaging with the patient and her family. [CH]  0177 Son is now at the bedside.  Patient is currently on hospice.  Per the son and the patient, their main goal is to keep her comfortable at the end of her life.  She never had chest pain before this bothered the patient; however, I discussed with them the possibility of abnormal lab work and that that may result in invasive testing.  They are not interested in this.  For this reason, I have canceled all lab testing.  We will place an IV and give her some fluids as she clinically appears dry and will address pain and nausea.  Plan will be for her to be discharged home and optimize her comfort. [CH]    Clinical Course User Index [CH] Abbe Bula, Barbette Hair, MD   MDM Rules/Calculators/A&P                           Patient presents today with chest discomfort.  Was initially a code STEMI by  EMS.  She does have some small  elevations inferior and laterally.  However, this was canceled by cardiology given her recent evaluations and hospice status.  I have confirmed with the patient and her son at the bedside.  Her son is very clear that they just want her to be comfortable.  We discussed the implications of getting lab work and if we are not going to act on that lab work, it was not worth getting lab work.  They both agree.  All labs were canceled.  They would not want any invasive cardiac testing or imaging.  Patient clinically does appear dry.  She was ordered fluids.  She was given pain and nausea medication.  Son states that she has been in pain at home.  She is only taking her oxycodone as needed.  Recommend that she take this more scheduled.  We will add Zofran for nausea and anorexia.  Recommend that they contact hospice on Monday for help with titration of medications to optimize pain control. Final Clinical Impression(s) / ED Diagnoses Final diagnoses:  Chest pain, unspecified type  Malignant neoplasm of lung, unspecified laterality, unspecified part of lung ([place])    Rx / DC Orders ED Discharge Orders          Ordered    ondansetron (ZOFRAN ODT) 4 MG disintegrating tablet  Every 8 hours PRN        02/25/21 0714             Merryl Hacker, MD 02/25/21 [phone]

## 2021-02-26 ENCOUNTER — Inpatient Hospital Stay: Payer: Medicare Other

## 2021-02-26 ENCOUNTER — Other Ambulatory Visit: Payer: Self-pay | Admitting: Hematology and Oncology

## 2021-02-26 ENCOUNTER — Inpatient Hospital Stay: Payer: Medicare Other | Attending: Oncology | Admitting: Oncology

## 2021-02-26 VITALS — BP 133/64 | HR 108 | Resp 20 | Ht <= 58 in

## 2021-02-26 DIAGNOSIS — C3412 Malignant neoplasm of upper lobe, left bronchus or lung: Secondary | ICD-10-CM

## 2021-02-26 LAB — BASIC METABOLIC PANEL
BUN: 18 (ref 4–21)
CO2: 37 — AB (ref 13–22)
Chloride: 94 — AB (ref 99–108)
Creatinine: 0.6 (ref 0.5–1.1)
Glucose: 191
Potassium: 5.2 (ref 3.4–5.3)
Sodium: 137 (ref 137–147)

## 2021-02-26 LAB — CBC
Absolute Lymphocytes: 0.17 — AB (ref 0.65–4.75)
MCV: 82 (ref 81–99)
RBC: 3.24 — AB (ref 3.87–5.11)

## 2021-02-26 LAB — CBC AND DIFFERENTIAL
HCT: 26 — AB (ref 36–46)
Hemoglobin: 8.2 — AB (ref 12.0–16.0)
Neutrophils Absolute: 16.1
Platelets: 468 — AB (ref 150–399)
WBC: 16.6

## 2021-02-26 LAB — HEPATIC FUNCTION PANEL
ALT: 18 (ref 7–35)
AST: 33 (ref 13–35)
Alkaline Phosphatase: 97 (ref 25–125)
Bilirubin, Total: 0.3

## 2021-02-26 LAB — CORRECTED CALCIUM (CC13): Calcium, Corrected: 11 10.2 (ref 8.4–?)

## 2021-02-26 LAB — COMPREHENSIVE METABOLIC PANEL
Albumin: 3.4 — AB (ref 3.5–5.0)
Calcium: 10.4 (ref 8.7–10.7)

## 2021-02-28 LAB — CULTURE, BLOOD (ROUTINE X 2)
Culture: NO GROWTH
Special Requests: ADEQUATE

## 2021-03-05 ENCOUNTER — Telehealth: Payer: Self-pay | Admitting: Oncology

## 2021-03-05 NOTE — Telephone Encounter (Signed)
Per 11/14 LOS, patient will be under [facility].  Does not need future Appt's

## 2021-04-15 DEATH — deceased

## 2021-06-20 ENCOUNTER — Encounter: Payer: Self-pay | Admitting: Oncology

## 2021-07-05 ENCOUNTER — Other Ambulatory Visit: Payer: Self-pay | Admitting: Oncology
